# Patient Record
Sex: Female | Born: 1990 | Hispanic: No | Marital: Married | Smoking: Never smoker
Health system: Southern US, Community
[De-identification: ages and names within clinical notes are randomized; demographics above are authoritative.]

---
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
## 2016-12-20 ENCOUNTER — Encounter (HOSPITAL_COMMUNITY): Payer: Self-pay | Admitting: *Deleted

## 2016-12-20 DIAGNOSIS — Z5321 Procedure and treatment not carried out due to patient leaving prior to being seen by health care provider: Secondary | ICD-10-CM | POA: Insufficient documentation

## 2016-12-20 DIAGNOSIS — R52 Pain, unspecified: Secondary | ICD-10-CM | POA: Insufficient documentation

## 2016-12-20 NOTE — ED Triage Notes (Signed)
Generalized body aches, intermittent fevers, and nausea for several days. No meds prior to arrival for the same.

## 2016-12-21 ENCOUNTER — Emergency Department (HOSPITAL_COMMUNITY)
Admission: EM | Admit: 2016-12-21 | Discharge: 2016-12-21 | Disposition: A | Discharge status: Home / Self Care | Payer: Self-pay | Attending: Dermatology | Admitting: Dermatology

## 2018-04-26 LAB — OB RESULTS CONSOLE VARICELLA ZOSTER ANTIBODY, IGG: Varicella: IMMUNE

## 2018-05-17 ENCOUNTER — Other Ambulatory Visit: Payer: Self-pay | Admitting: Obstetrics and Gynecology

## 2018-05-17 ENCOUNTER — Other Ambulatory Visit (HOSPITAL_COMMUNITY)
Admission: RE | Admit: 2018-05-17 | Discharge: 2018-05-17 | Disposition: A | Discharge status: Home / Self Care | Payer: BLUE CROSS/BLUE SHIELD | Source: Ambulatory Visit | Attending: Obstetrics and Gynecology | Admitting: Obstetrics and Gynecology

## 2018-05-17 DIAGNOSIS — Z01411 Encounter for gynecological examination (general) (routine) with abnormal findings: Secondary | ICD-10-CM | POA: Insufficient documentation

## 2018-05-20 LAB — CYTOLOGY - PAP
CHLAMYDIA, DNA PROBE: NEGATIVE
HPV (WINDOPATH): DETECTED — AB
NEISSERIA GONORRHEA: NEGATIVE

## 2018-09-28 LAB — OB RESULTS CONSOLE RPR: RPR: NONREACTIVE

## 2018-10-31 ENCOUNTER — Ambulatory Visit: Payer: Self-pay | Admitting: Clinical

## 2018-10-31 ENCOUNTER — Ambulatory Visit (INDEPENDENT_AMBULATORY_CARE_PROVIDER_SITE_OTHER): Payer: BLUE CROSS/BLUE SHIELD | Admitting: Advanced Practice Midwife

## 2018-10-31 ENCOUNTER — Encounter: Payer: Self-pay | Admitting: Advanced Practice Midwife

## 2018-10-31 DIAGNOSIS — Z3480 Encounter for supervision of other normal pregnancy, unspecified trimester: Secondary | ICD-10-CM | POA: Insufficient documentation

## 2018-10-31 DIAGNOSIS — Z3483 Encounter for supervision of other normal pregnancy, third trimester: Secondary | ICD-10-CM

## 2018-10-31 NOTE — BH Specialist Note (Deleted)
Error

## 2018-10-31 NOTE — Patient Instructions (Signed)

## 2018-10-31 NOTE — Progress Notes (Signed)
  Subjective:   Carolyn Wallace is a 27 y.o. G1P0 at 10127w6d by LMP being seen today for her first obstetrical visit.  Her obstetrical history is significant for transfer of care late in pregnancy. Patient does intend to breast feed. Pregnancy history fully reviewed.   Patient dismissed from [facility] due to refusing Rhogam at 28 weeks. She is still feeling upset about this and does not with to discuss this again today.   Patient reports no complaints.  HISTORY: OB History  Gravida Para Term Preterm AB Living  1 0 0 0 0 0  SAB TAB Ectopic Multiple Live Births  0 0 0 0 0    # Outcome Date GA Lbr Len/2nd Weight Sex Delivery Anes PTL Lv  1 Current             Last pap smear was done 04/2018 and was abnormal - LGSIL, +HPV  History reviewed. No pertinent past medical history. Past Surgical History:  Procedure Laterality Date  . APPENDECTOMY  2007   Family History  Problem Relation Age of Onset  . Crohn's disease Paternal Uncle    Social History   Tobacco Use  . Smoking status: Never Smoker  . Smokeless tobacco: Never Used  Substance Use Topics  . Alcohol use: No  . Drug use: No   No Known Allergies Current Outpatient Medications on File Prior to Visit  Medication Sig Dispense Refill  . Prenatal MV & Min w/FA-DHA ([facility] PRENATAL GUMMY PO) Take 1 tablet by mouth daily.     No current facility-administered medications on file prior to visit.     Review of Systems Pertinent items noted in HPI and remainder of comprehensive ROS otherwise negative.  Exam   Vitals:   10/31/18 1341  BP: 106/70  Pulse: 79  Weight: 126 lb 8 oz (57.4 kg)   Fetal Heart Rate (bpm): 145  FH 33 cm  Physical Exam  Constitutional: She is oriented to person, place, and time. She appears well-developed and well-nourished. No distress.  HENT:  Head: Normocephalic.  Cardiovascular: Normal rate.  Pulmonary/Chest: Effort normal.  Abdominal: Soft. There is no tenderness.  Neurological: She is alert and  oriented to person, place, and time.  Skin: Skin is warm and dry.  Psychiatric: She has a normal mood and affect.  Nursing note and vitals reviewed.  Assessment:   Pregnancy: G1P0 Patient Active Problem List   Diagnosis Date Noted  . Supervision of other normal pregnancy, antepartum 10/31/2018     Plan:  1. Supervision of other normal pregnancy, antepartum - Routine care - Attempting to get full records, US results not scanned into epic - I did inform the patient that rhogam would again be offered prior to DC home if the baby's blood type was +. She appreciates hearing this information, and she is undecided if she will accept rhogam at time. She will consider it.   Initial labs drawn. Continue prenatal vitamins. Genetic Screening discussed, First trimester screen, Quad screen and NIPS: declined. Ultrasound discussed; fetal anatomic survey: records pending from prior office . Problem list reviewed and updated. The nature of [facility] with multiple MDs and other Advanced Practice Providers was explained to patient; also emphasized that residents, students are part of our team. Routine obstetric precautions reviewed. 50% of 45 min visit spent in counseling and coordination of care. Return in about 2 weeks (around 11/14/2018).

## 2018-11-04 LAB — CULTURE, OB URINE

## 2018-11-04 LAB — URINE CULTURE, OB REFLEX

## 2018-11-07 ENCOUNTER — Telehealth: Payer: Self-pay | Admitting: *Deleted

## 2018-11-07 NOTE — Telephone Encounter (Signed)
Called pt to inform her of her UTI and need for antibiotics.  Requested that pt provide a pharmacy for the antibiotic to be sent to.  The pt declined and stated "I don't need a prescription. I'll take care of it."  [facility].

## 2018-11-07 NOTE — Telephone Encounter (Signed)
-----   Message from Carolyn ReichertHeather D Hogan, CNM sent at 11/07/2018  8:28 AM EST ----- Patient has a UTI. Please call her and find out a pharmacy for her. There is no pharmacy on file. Need Macrobid BID x 7 days.

## 2018-11-08 NOTE — BH Specialist Note (Signed)
Patient not seen.

## 2018-11-11 ENCOUNTER — Encounter: Payer: Self-pay | Admitting: *Deleted

## 2018-11-17 ENCOUNTER — Encounter: Payer: Self-pay | Admitting: Obstetrics and Gynecology

## 2018-11-18 ENCOUNTER — Encounter (HOSPITAL_COMMUNITY): Payer: Self-pay

## 2018-11-18 ENCOUNTER — Inpatient Hospital Stay (HOSPITAL_COMMUNITY)
Admission: AD | Admit: 2018-11-18 | Discharge: 2018-11-22 | DRG: 807 | Disposition: A | Discharge status: Home / Self Care | Payer: BLUE CROSS/BLUE SHIELD | Attending: Obstetrics and Gynecology | Admitting: Obstetrics and Gynecology

## 2018-11-18 ENCOUNTER — Other Ambulatory Visit: Payer: Self-pay

## 2018-11-18 DIAGNOSIS — O4202 Full-term premature rupture of membranes, onset of labor within 24 hours of rupture: Secondary | ICD-10-CM

## 2018-11-18 DIAGNOSIS — Z3A36 36 weeks gestation of pregnancy: Secondary | ICD-10-CM

## 2018-11-18 DIAGNOSIS — Z6791 Unspecified blood type, Rh negative: Secondary | ICD-10-CM

## 2018-11-18 DIAGNOSIS — O42913 Preterm premature rupture of membranes, unspecified as to length of time between rupture and onset of labor, third trimester: Principal | ICD-10-CM | POA: Diagnosis present

## 2018-11-18 DIAGNOSIS — O26893 Other specified pregnancy related conditions, third trimester: Secondary | ICD-10-CM | POA: Diagnosis present

## 2018-11-18 DIAGNOSIS — Z3480 Encounter for supervision of other normal pregnancy, unspecified trimester: Secondary | ICD-10-CM

## 2018-11-18 DIAGNOSIS — O4290 Premature rupture of membranes, unspecified as to length of time between rupture and onset of labor, unspecified weeks of gestation: Secondary | ICD-10-CM | POA: Diagnosis present

## 2018-11-18 DIAGNOSIS — O42113 Preterm premature rupture of membranes, onset of labor more than 24 hours following rupture, third trimester: Secondary | ICD-10-CM | POA: Diagnosis not present

## 2018-11-18 DIAGNOSIS — Z6741 Type O blood, Rh negative: Secondary | ICD-10-CM | POA: Diagnosis not present

## 2018-11-18 LAB — TYPE AND SCREEN
ABO/RH(D): O NEG
ANTIBODY SCREEN: NEGATIVE

## 2018-11-18 LAB — CBC
HCT: 33.2 % — ABNORMAL LOW (ref 36.0–46.0)
Hemoglobin: 11.4 g/dL — ABNORMAL LOW (ref 12.0–15.0)
MCH: 25.6 pg — ABNORMAL LOW (ref 26.0–34.0)
MCHC: 34.3 g/dL (ref 30.0–36.0)
MCV: 74.6 fL — ABNORMAL LOW (ref 80.0–100.0)
Platelets: 126 10*3/uL — ABNORMAL LOW (ref 150–400)
RBC: 4.45 MIL/uL (ref 3.87–5.11)
RDW: 13.4 % (ref 11.5–15.5)
WBC: 8.5 10*3/uL (ref 4.0–10.5)
nRBC: 0 % (ref 0.0–0.2)

## 2018-11-18 LAB — POCT FERN TEST: POCT Fern Test: POSITIVE

## 2018-11-18 MED ORDER — SOD CITRATE-CITRIC ACID 500-334 MG/5ML PO SOLN: 30.0000 mL | ORAL | Status: DC | PRN

## 2018-11-18 MED ORDER — LACTATED RINGERS IV SOLN
500.0000 mL | INTRAVENOUS | Status: DC | PRN
  Administered 2018-11-19: 500 mL via INTRAVENOUS

## 2018-11-18 MED ORDER — OXYCODONE-ACETAMINOPHEN 5-325 MG PO TABS: 2.0000 | ORAL_TABLET | ORAL | Status: DC | PRN

## 2018-11-18 MED ORDER — LIDOCAINE HCL (PF) 1 % IJ SOLN
30.0000 mL | INTRAMUSCULAR | Status: DC | PRN
  Administered 2018-11-20: 30 mL via SUBCUTANEOUS
  Filled 2018-11-18: qty 30

## 2018-11-18 MED ORDER — PENICILLIN G 3 MILLION UNITS IVPB - SIMPLE MED
3.0000 10*6.[IU] | INTRAVENOUS | Status: DC
  Filled 2018-11-18 (×5): qty 100

## 2018-11-18 MED ORDER — OXYTOCIN 40 UNITS IN LACTATED RINGERS INFUSION - SIMPLE MED
2.5000 [IU]/h | INTRAVENOUS | Status: DC
  Administered 2018-11-20: 2.5 [IU]/h via INTRAVENOUS
  Filled 2018-11-18: qty 1000

## 2018-11-18 MED ORDER — OXYTOCIN BOLUS FROM INFUSION
500.0000 mL | Freq: Once | INTRAVENOUS | Status: AC
  Administered 2018-11-20: 500 mL via INTRAVENOUS

## 2018-11-18 MED ORDER — LACTATED RINGERS IV SOLN: INTRAVENOUS | Status: DC

## 2018-11-18 MED ORDER — SODIUM CHLORIDE 0.9 % IV SOLN
5.0000 10*6.[IU] | Freq: Once | INTRAVENOUS | Status: DC
  Filled 2018-11-18: qty 5

## 2018-11-18 MED ORDER — OXYCODONE-ACETAMINOPHEN 5-325 MG PO TABS: 1.0000 | ORAL_TABLET | ORAL | Status: DC | PRN

## 2018-11-18 MED ORDER — ONDANSETRON HCL 4 MG/2ML IJ SOLN: 4.0000 mg | Freq: Four times a day (QID) | INTRAMUSCULAR | Status: DC | PRN

## 2018-11-18 MED ORDER — ACETAMINOPHEN 325 MG PO TABS: 650.0000 mg | ORAL_TABLET | ORAL | Status: DC | PRN

## 2018-11-18 NOTE — MAU Note (Signed)
Big gush at 0600, clear fluid, still coming. No bleeding. No pain.

## 2018-11-18 NOTE — Consult Note (Signed)
Neonatology consultation:  Asked by [facility] to speak with patient about neonatal implications of withholding treatment with antenatal steroids and intrapartum antibiotic prophylaxis.  (Patient had declined IOL, BMZ, and latency PCN).  I reviewed reasons for BMZ (reduction in likelihood of respiratory distress) and IAP (reduction of risk for newborn sepsis).  Also discussed possible risk of infection to both baby and herself due to PROM and therefore the recommendation for IOL.  Patient and FOB were attentive and friendly but they had no questions and she maintained her reluctance for these interventions (told nurse she didn't like medications).  Stated she would reconsider IOL tomorrow and that she understood if she developed Sx of infection that delivery would definitely be indicated.  I volunteered to re-visit these issues if she and FOB had any second thoughts later.  Thank you for consulting Neonatology. Total time 20 minutes   Demetrius Barrell E. Barrie DunkerWimmer, Jr., MD Neonatologist

## 2018-11-18 NOTE — Progress Notes (Signed)
Patient ID: Carolyn Wallace, female   DOB: [birth year], 27 y.o.   MRN: [id number]  Comfortable; feeling mild cramping; declines induction method until the morning; "will consider" at that time; Dr Eric FormWimmer in recently to review R&Bs of PCN, BMZ  BP 109/72, P 92 FHR 140s, +accels, no decels Rare ctx Cx deferred (vtx by U/S earlier)  IUP@36 .3wks PPROM GBS unknown  Declines BMZ/PCN; agrees to GBS culture for peds purposes; discussed will not do cervical exams unless/until pt is ready to induce labor due to additional risk of infection- pt and spouse agree and are desiring to delay intervention in labor process until at least the morning  Carolyn Wallace CNM 11/18/2018 10:41 PM

## 2018-11-18 NOTE — H&P (Signed)
Carolyn Wallace is a 27 y.o. female G1P0000 at 1979w3d presenting for PROM at 1800 hours today. Dating is based on LMP. She [facility]. She denies uterine contractions, vaginal bleeding, decreased fetal movement, fever, falls, or recent illness.    Patient history includes late transfer of care to Faculty practice. She was [facility] for refusing Rhogam at 28 weeks.  OB History    Gravida  1   Para  0   Term  0   Preterm  0   AB  0   Living  0     SAB  0   TAB  0   Ectopic  0   Multiple  0   Live Births  0          Patient Active Problem List   Diagnosis Date Noted  . PROM (premature rupture of membranes) 11/18/2018  . Supervision of other normal pregnancy, antepartum 10/31/2018    History reviewed. No pertinent past medical history. Past Surgical History:  Procedure Laterality Date  . APPENDECTOMY  2007   Family History: family history includes Crohn's disease in her paternal uncle. Social History:  reports that she has never smoked. She has never used smokeless tobacco. She reports that she does not drink alcohol or use drugs.     Maternal Diabetes: No Genetic Screening: Declined Maternal Ultrasounds/Referrals: Normal Fetal Ultrasounds or other Referrals:  None Maternal Substance Abuse:  No Significant Maternal Medications:  None Significant Maternal Lab Results:  None Other Comments:  GBS unknown  Review of Systems  Respiratory: Negative for shortness of breath.   Gastrointestinal: Negative for abdominal pain.  Neurological: Negative for dizziness and headaches.  All other systems reviewed and are negative.  Maternal Medical History:  Reason for admission: Rupture of membranes.   Contractions: Onset was 1-2 hours ago.   Frequency: rare.    Fetal activity: Perceived fetal activity is normal.   Last perceived fetal movement was within the past hour.    Prenatal complications: no prenatal  complications Prenatal Complications - Diabetes: none.      Blood pressure 107/85, pulse (!) 101, temperature 97.8 F (36.6 C), temperature source Oral, resp. rate 18, weight 59.1 kg, last menstrual period 03/08/2018. Maternal Exam:  Uterine Assessment: Contraction strength is mild.  Contraction frequency is rare.   Abdomen: Patient reports no abdominal tenderness. Fetal presentation: vertex  Cervix: Cervical exam deferred  Fetal Exam Fetal Monitor Review: Baseline rate: 145.  Variability: moderate (6-25 bpm).   Pattern: accelerations present.    Fetal State Assessment: Category I - tracings are normal.     Physical Exam  Nursing note and vitals reviewed. Constitutional: She is oriented to person, place, and time. She appears well-developed and well-nourished.  Cardiovascular: Normal rate.  Respiratory: Effort normal. No respiratory distress.  GI: She exhibits no distension. There is no abdominal tenderness. There is no rebound and no guarding.  Gravid  Musculoskeletal: Normal range of motion.  Neurological: She is alert and oriented to person, place, and time.  Skin: Skin is warm and dry.  Psychiatric: She has a normal mood and affect. Her behavior is normal. Judgment and thought content normal.    Prenatal labs: ABO, Rh:   Antibody:   Rubella:   RPR: Nonreactive (10/30 0000)  HBsAg:    HIV:    GBS:     Assessment/Plan: --27 y.o. G1P0000 at 7379w3d  --PROM today at 1800 hours --Category I tracing, no UCs --Patient  declining GBS prophylaxis. Discussed recommendation for prophylaxis for preterm.  --Patient consents to IV saline lock --No birth plan but plans unmedicated labor, birth bar for second stage   Girl/breast/ does not desire contraception  Calvert CantorSamantha C Weinhold, CNM 11/18/2018, 7:54 PM

## 2018-11-18 NOTE — Progress Notes (Signed)
Pt informed that the ultrasound is considered a limited OB ultrasound and is not intended to be a complete ultrasound exam.  Patient also informed that the ultrasound is not being completed with the intent of assessing for fetal or placental anomalies or any pelvic abnormalities.  Explained that the purpose of today's ultrasound is to assess for  presentation.  Patient acknowledges the purpose of the exam and the limitations of the study.  Vertex   Carolyn Kostka, NP    

## 2018-11-19 LAB — ABO/RH: ABO/RH(D): O NEG

## 2018-11-19 LAB — RPR: RPR Ser Ql: NONREACTIVE

## 2018-11-19 MED ORDER — MISOPROSTOL 50MCG HALF TABLET
50.0000 ug | ORAL_TABLET | ORAL | Status: DC
  Administered 2018-11-19 (×2): 50 ug via ORAL
  Filled 2018-11-19 (×11): qty 1

## 2018-11-19 NOTE — Progress Notes (Signed)
LABOR PROGRESS NOTE  Carolyn Wallace is a 27 y.o. G1P0000 at 38110w4d  admitted for PPROM  Subjective: Not feeling painful contractions  Continues to leak fluid   Objective: BP 110/75   Pulse 94   Temp 98.2 F (36.8 C) (Oral)   Resp 16   Ht 5\' 4"  (1.626 m)   Wt 59.1 kg   LMP 03/08/2018 (Exact Date)   BMI 22.36 kg/m  or  Vitals:   11/19/18 0910 11/19/18 1100 11/19/18 1215 11/19/18 1400  BP: 118/77 101/65 (!) 101/59 110/75  Pulse: 91 91 100 94  Resp: 18 18 16    Temp: 98 F (36.7 C)  98 F (36.7 C) 98.2 F (36.8 C)  TempSrc: Oral  Oral Oral  Weight:      Height:        Dilation: 1.5 Effacement (%): 70, 80 Station: -3 Presentation: Vertex Exam by:: Dr. Janina MayoPhillip  FHT: baseline rate 140s, moderate varibility, +acel, no decel Toco: irritability and irregular contractions  Labs: Lab Results  Component Value Date   WBC 8.5 11/18/2018   HGB 11.4 (L) 11/18/2018   HCT 33.2 (L) 11/18/2018   MCV 74.6 (L) 11/18/2018   PLT 126 (L) 11/18/2018    Patient Active Problem List   Diagnosis Date Noted  . PROM (premature rupture of membranes) 11/18/2018  . Supervision of other normal pregnancy, antepartum 10/31/2018    Assessment / Plan: 27 y.o. G1P0000 at 42110w4d here for IOL for PPROM  Labor: patient would like to avoid all medical intervention. Encouraged pt to start nipple stimulation and continue walking to get contractions started. Patient states she will think about potentially accepting cytotec and let the team know this after around 4pm if no improvement in painful contractions at that time Fetal Wellbeing:  Cat 1 Pain Control:  Desires unmedicated birth  Anticipated MOD:  SVD  Gwenevere AbbotNimeka Gianno Volner, MD  OB Fellow  11/19/2018, 3:04 PM

## 2018-11-19 NOTE — Progress Notes (Signed)
LABOR PROGRESS NOTE  Carolyn Wallace is a 27 y.o. G1P0000 at 2869w4d admitted for IOL for PPROM  Subjective: Discussed no cervical change with patient  Patient is agreeable to 1 dose of cytotec and FB now  Objective: BP 117/82   Pulse 82   Temp (!) 97.5 F (36.4 C) (Oral)   Resp 18   Ht 5\' 4"  (1.626 m)   Wt 59.1 kg   LMP 03/08/2018 (Exact Date)   BMI 22.36 kg/m  or  Vitals:   11/19/18 1620 11/19/18 1715 11/19/18 1800 11/19/18 1905  BP: 108/75 115/74 116/77 117/82  Pulse: 83 78 77 82  Resp: 16 16 16 18   Temp: 98 F (36.7 C)  (!) 97.5 F (36.4 C)   TempSrc: Oral  Oral   Weight:      Height:        Dilation: 1.5 Effacement (%): 60, 70 Station: -2 Presentation: Vertex Exam by:: Dr. Janina MayoPhillip  FHT: baseline rate 130, moderate varibility, + acel, no decel Toco: irregular contractions  Labs: Lab Results  Component Value Date   WBC 8.5 11/18/2018   HGB 11.4 (L) 11/18/2018   HCT 33.2 (L) 11/18/2018   MCV 74.6 (L) 11/18/2018   PLT 126 (L) 11/18/2018    Patient Active Problem List   Diagnosis Date Noted  . PROM (premature rupture of membranes) 11/18/2018  . Supervision of other normal pregnancy, antepartum 10/31/2018    Assessment / Plan: 27 y.o. G1P0000 at 3269w4d here for IOL for PPROM  Labor: FB placed and PO cytotec given  Fetal Wellbeing:  Cat 1 Pain Control:  Desires unmedicated delivery  Anticipated MOD:  SVD  Carolyn AbbotNimeka Jahmil Macleod, MD  OB Fellow  11/19/2018, 7:09 PM

## 2018-11-19 NOTE — Progress Notes (Signed)
Labor Progress Note Carolyn Wallace is a 27 y.o. G1P0000 at 880w4d presented for PPROM  S:  Patient reporting worsening contractions. Foley bulb still in place  O:  BP 119/86   Pulse 90   Temp (!) 97.4 F (36.3 C) (Oral)   Resp 18   Ht 5\' 4"  (1.626 m)   Wt 59.1 kg   LMP 03/08/2018 (Exact Date)   BMI 22.36 kg/m   Fetal Tracing:  Baseline: 130 Variability: moderate Accels: 15x15 Decels: none  Toco: 1-2   CVE: Dilation: 1.5 Effacement (%): 60, 70 Station: -2 Presentation: Vertex Exam by:: Dr. Aneta MinsPhillip    A&P: 27 y.o. G1P0000 7480w4d PPROM #Labor: Patient agreeable to second dose of cytotec.  Lengthy discussion with patient about desires regarding IOL. Patient agreeable to cytotec and FB but does not want pitocin. Does not want AROM. Desires nipple stimulation and movement when foley bulb is out.   Patient signed blood product refusal form. Patient is not JW but does not want to receive blood. Denies any specific reason. Explained procedures in event of an emergency and patient adamant that she does not want blood even if death is possible.   Discussed with patient risk of infection with prolonged ROM. Patient has refused GBS prophylaxis but is agreeable to intrapartum treatment of infection if necessary.  Patient still refusing BMZ.  Patient is agreeable to c/s if fetus is in distress.   #Pain: Patient does not want medication #FWB: Cat 1 #GBS in process  Rolm Bookbinderaroline M Odean Fester, CNM 9:32 PM

## 2018-11-20 ENCOUNTER — Encounter (HOSPITAL_COMMUNITY): Payer: Self-pay

## 2018-11-20 DIAGNOSIS — Z3A36 36 weeks gestation of pregnancy: Secondary | ICD-10-CM

## 2018-11-20 DIAGNOSIS — Z6741 Type O blood, Rh negative: Secondary | ICD-10-CM

## 2018-11-20 DIAGNOSIS — O42113 Preterm premature rupture of membranes, onset of labor more than 24 hours following rupture, third trimester: Secondary | ICD-10-CM

## 2018-11-20 MED ORDER — DIBUCAINE 1 % RE OINT: 1.0000 "application " | TOPICAL_OINTMENT | RECTAL | Status: DC | PRN

## 2018-11-20 MED ORDER — IBUPROFEN 800 MG PO TABS
800.0000 mg | ORAL_TABLET | Freq: Three times a day (TID) | ORAL | Status: DC
  Administered 2018-11-20 – 2018-11-22 (×6): 800 mg via ORAL
  Filled 2018-11-20 (×8): qty 1

## 2018-11-20 MED ORDER — ONDANSETRON HCL 4 MG PO TABS: 4.0000 mg | ORAL_TABLET | ORAL | Status: DC | PRN

## 2018-11-20 MED ORDER — COCONUT OIL OIL: 1.0000 "application " | TOPICAL_OIL | Status: DC | PRN

## 2018-11-20 MED ORDER — SIMETHICONE 80 MG PO CHEW: 80.0000 mg | CHEWABLE_TABLET | ORAL | Status: DC | PRN

## 2018-11-20 MED ORDER — ONDANSETRON HCL 4 MG/2ML IJ SOLN: 4.0000 mg | INTRAMUSCULAR | Status: DC | PRN

## 2018-11-20 MED ORDER — SENNOSIDES-DOCUSATE SODIUM 8.6-50 MG PO TABS
2.0000 | ORAL_TABLET | ORAL | Status: DC
  Administered 2018-11-20 – 2018-11-21 (×2): 2 via ORAL
  Filled 2018-11-20 (×3): qty 2

## 2018-11-20 MED ORDER — OXYCODONE-ACETAMINOPHEN 5-325 MG PO TABS: 1.0000 | ORAL_TABLET | ORAL | Status: DC | PRN

## 2018-11-20 MED ORDER — ACETAMINOPHEN 325 MG PO TABS: 650.0000 mg | ORAL_TABLET | ORAL | Status: DC | PRN

## 2018-11-20 MED ORDER — BENZOCAINE-MENTHOL 20-0.5 % EX AERO
1.0000 "application " | INHALATION_SPRAY | CUTANEOUS | Status: DC | PRN
  Filled 2018-11-20: qty 56

## 2018-11-20 MED ORDER — DIPHENHYDRAMINE HCL 25 MG PO CAPS: 25.0000 mg | ORAL_CAPSULE | Freq: Four times a day (QID) | ORAL | Status: DC | PRN

## 2018-11-20 MED ORDER — TETANUS-DIPHTH-ACELL PERTUSSIS 5-2.5-18.5 LF-MCG/0.5 IM SUSP: 0.5000 mL | Freq: Once | INTRAMUSCULAR | Status: DC

## 2018-11-20 MED ORDER — LACTATED RINGERS IV SOLN: INTRAVENOUS | Status: DC

## 2018-11-20 MED ORDER — WITCH HAZEL-GLYCERIN EX PADS: 1.0000 "application " | MEDICATED_PAD | CUTANEOUS | Status: DC | PRN

## 2018-11-20 MED ORDER — PRENATAL MULTIVITAMIN CH: 1.0000 | ORAL_TABLET | Freq: Every day | ORAL | Status: DC

## 2018-11-20 MED ORDER — ZOLPIDEM TARTRATE 5 MG PO TABS: 5.0000 mg | ORAL_TABLET | Freq: Every evening | ORAL | Status: DC | PRN

## 2018-11-20 NOTE — Lactation Note (Signed)
This note was copied from a baby's chart. Lactation Consultation Note  Patient Name: Carolyn Wallace JXBJY'NToday's Date: 11/20/2018 Reason for consult: Follow-up assessment;Late-preterm 34-36.6wks;Primapara;1st time breastfeeding  P1 mother whose infant is now 7214 hours old.  This is a LPTI at 36+5 weeks weighing < 6 lbs  Mother was doing STS with baby when I arrived.  She did not have the "Caring For Your Late Preterm Baby" guidelines at bedside so I obtained these and reviewed them with the parents. Mother has been pumping with the DEBP and only obtaining a couple of drops at this time.  Reassured mother that this was typical and to continue pumping every 3 hours for 15 minutes.  Encouraged hand expression before/after feedings and pumping to help increase milk supply.  Mother will feed back any EBM she obtains to baby.  Mother will awaken baby at the three hour interval if she remains sleeping.  Parents commented that they have only had to awaken her one time so far.  She has been awakening with help and showing feeding cues.  Suggested mother ask RN/LC for latch assistance as needed and reviewed supplementation guidelines.  Reminded mother that the minimum volumes increase when baby is 1424 hours old.  Mother verbalized understanding and will follow the guidelines, allowing baby to feed more than the minimum if she desires without restriction.  Father present and supportive.   Maternal Data Formula Feeding for Exclusion: No Does the patient have breastfeeding experience prior to this delivery?: No  Feeding Feeding Type: Breast Fed Nipple Type: Slow - flow  LATCH Score                   Interventions    Lactation Tools Discussed/Used WIC Program: No Initiated by:: Already initiated; mother has no questions about pumping   Consult Status Consult Status: Follow-up Date: 11/21/18 Follow-up type: In-patient    Dora SimsBeth R Martesha Niedermeier 11/20/2018, 4:45 PM

## 2018-11-20 NOTE — Progress Notes (Signed)
Labor Progress Note Carolyn Wallace is a 27 y.o. G1P0000 at 1240w5d presented for PPROM  S:  Patient reporting worsening contractions  O:  BP 124/79   Pulse 84   Temp (!) 97.4 F (36.3 C) (Oral)   Resp 18   Ht 5\' 4"  (1.626 m)   Wt 59.1 kg   LMP 03/08/2018 (Exact Date)   BMI 22.36 kg/m    Fetal Tracing:  Baseline: 135 Variability: moderate Accels: 15x15 Decels: none  Toco:1-4 with ui   CVE: Dilation: 1.5 Effacement (%): 60, 70 Station: -2 Presentation: Vertex Exam by:: Dr. Aneta MinsPhillip    A&P: 27 y.o. G1P0000 2040w5d PPROM #Labor: Progressing well. Foley bulb fell out but patient refusing cervical exam. Reporting intermittent pressure. Will continue to observe #Pain: per patient request, nothing at this time #FWB: Cat 1 #GBS pending, refusing prophylaxis  Carolyn Wallace, CNM 1:04 AM

## 2018-11-20 NOTE — Progress Notes (Signed)
Pt and FOB does not want to be bothered. Has not had any rest for over two days. POC explained to pt. Told pt I'll stay out of her as much as possible. To call if needed.

## 2018-11-20 NOTE — Lactation Note (Signed)
This note was copied from a baby's chart. Lactation Consultation Note  Patient Name: Carolyn Wallace ZOXWR'UToday's Date: 11/20/2018 Reason for consult: Initial assessment;Late-preterm 34-36.6wks;1st time breastfeeding P1, 4 hour female infant. Mom's feeding choice at admission was breast and supplementing with formula. BF concerns: LPTI less than 5 lbs. Per day infant had one void diaper in L&D. Dad was doing STS as LC entered room. Per nurse, mom just finished breastfeeding for 10 minutes. LC did not observe latch at this time. Mom was using DEBP and mom understands to use DEBP every 3 hours for 15 minutes. Mom shown how to use DEBP & how to disassemble, clean, & reassemble parts. LC assisted dad in giving infant 5 ml of Similac Neosure 22 kcal on glove finger using curve tip syringe for supplement after breastfeeding.  Mom knows to breastfeed by hunger cues and not exceed 3 hours without breastfeeding infant. LC discussed do not feed infant longer than 30 minutes at a time. LC discussed I & O. Mom made aware of O/P services, breastfeeding support groups, community resources, and our phone # for post-discharge questions.   Mom's BF plans: 1. BF infant according hunger cues and not exceed 3 hours without breastfeeding infant. 2. Parents will supplement with EBM or formula according infant age/ hours of life after Mom's feeds at breast. 3. Parents will do as much STS as possible. 4. Mom will use DEBP every 3 hours for 15 minutes. 5. Mom will ask Nurse or LC for help if she has any question or concerns or need assistance with breastfeeding.  Maternal Data Formula Feeding for Exclusion: Yes Reason for exclusion: Mother's choice to formula and breast feed on admission Has patient been taught Hand Expression?: Yes(Mom demonstrated hand expression and colostrum present both breast.) Does the patient have breastfeeding experience prior to this delivery?: No  Feeding Feeding Type:  Formula  LATCH Score Latch: Repeated attempts needed to sustain latch, nipple held in mouth throughout feeding, stimulation needed to elicit sucking reflex.  Audible Swallowing: None  Type of Nipple: Everted at rest and after stimulation  Comfort (Breast/Nipple): Soft / non-tender  Hold (Positioning): Assistance needed to correctly position infant at breast and maintain latch.  LATCH Score: 6  Interventions Interventions: Breast feeding basics reviewed;DEBP  Lactation Tools Discussed/Used Tools: Pump Breast pump type: Double-Electric Breast Pump WIC Program: No Pump Review: Setup, frequency, and cleaning;Milk Storage Initiated by:: by Nurse Date initiated:: 11/20/18   Consult Status Consult Status: Follow-up Date: 11/20/18 Follow-up type: In-patient    Danelle EarthlyRobin Isaak Delmundo 11/20/2018, 6:17 AM

## 2018-11-20 NOTE — Progress Notes (Signed)
Delivery Note  Obstetrician: Hermina StaggersMichael L Velta Rockholt  Assistant: NA  Pre-Delivery Diagnosis: Preterm pregnancy 36 5/7 weeks, PROM  Post-Delivery Diagnosis: Viable female infant  Procedure: Spontaneous vaginal delivery  Surgeon: Hermina StaggersMichael L Patrecia Veiga   Assistants: NA  Anesthesia: Local anesthesia 1% plain lidocaine   Episiotomy or Incision: Left labial laceration  Infant Wt: As recorded  Apgars: As recorded  Placenta and Cord:         Mechanism: spontaneous       Description:  3 vessel cord and intact  Estimated Blood Loss:  155 ml               Specimens: Placenta to pathology         Complications:  prolonged ROM         Condition: stable  Infant Feeding:    breast  Pt progressed to C/C +3 and with ut ctx, viable female infant delivered without problems. Cord clamped after 1 minute delay. Cord blood collected. 3 vessel cord, intact placenta by gentle traction. Placenta to pathology. Small right labial laceration repaired with 3/0 Vicryl.  Mother and infant recovering in room together. All counts correct x 2.

## 2018-11-21 LAB — CBC
HEMATOCRIT: 29.7 % — AB (ref 36.0–46.0)
Hemoglobin: 10.2 g/dL — ABNORMAL LOW (ref 12.0–15.0)
MCH: 25.5 pg — ABNORMAL LOW (ref 26.0–34.0)
MCHC: 34.3 g/dL (ref 30.0–36.0)
MCV: 74.3 fL — ABNORMAL LOW (ref 80.0–100.0)
Platelets: 127 10*3/uL — ABNORMAL LOW (ref 150–400)
RBC: 4 MIL/uL (ref 3.87–5.11)
RDW: 13.3 % (ref 11.5–15.5)
WBC: 16.9 10*3/uL — AB (ref 4.0–10.5)
nRBC: 0 % (ref 0.0–0.2)

## 2018-11-21 LAB — CULTURE, BETA STREP (GROUP B ONLY)

## 2018-11-21 NOTE — Lactation Note (Signed)
This note was copied from a baby's chart. Lactation Consultation Note  Patient Name: Girl Cam Haialia Nobbe ZOXWR'UToday's Date: 11/21/2018 Reason for consult: Follow-up assessment;Infant < 6lbs;Late-preterm 34-36.6wks Baby is 6635 hours old.  Mom is breastfeeding with cues and supplementing with formula after.  She is also pumping every 3 hours to stimulate a good milk supply.  Parents will begin supplementing with slow flow nipples instead of finger feeding.  Mom denies questions or concerns.  Encouraged to continue plan following late preterm guidelines and call for assist prn.  Maternal Data    Feeding    LATCH Score                   Interventions    Lactation Tools Discussed/Used     Consult Status Consult Status: Follow-up Date: 11/22/18 Follow-up type: In-patient    Huston FoleyMOULDEN, Athanasius Kesling S 11/21/2018, 1:02 PM

## 2018-11-21 NOTE — Progress Notes (Signed)
Post Partum Day 1  Subjective: Doing well, no complaints this morning. Was able to get some sleep last night. Thinks will be staying until tomorrow because of baby being a little early.  Objective: Blood pressure 107/66, pulse 60, temperature 98.3 F (36.8 C), resp. rate 16, height 5\' 4"  (1.626 m), weight 59.1 kg, last menstrual period 03/08/2018, SpO2 100 %, unknown if currently breastfeeding.  Physical Exam:  General: well-appearing, NAD Lochia: appropriate Uterine Fundus: firm Incision: n/a DVT Evaluation: No significant calf/ankle edema.  Recent Labs    11/18/18 2000 11/21/18 0519  HGB 11.4* 10.2*  HCT 33.2* 29.7*    Assessment/Plan: Plan for discharge tomorrow  Lactation support as needed Stable H/H  Did not receive RhoGAM in pregnancy, Rh(-), infant also Rh(-) so no Rhogam indicated at this time   LOS: 3 days   Laurel S Wallace 11/21/2018, 3:52 PM

## 2018-11-22 MED ORDER — IBUPROFEN 800 MG PO TABS: 800.0000 mg | ORAL_TABLET | Freq: Three times a day (TID) | ORAL | 1 refills | Status: DC

## 2018-11-22 MED ORDER — SENNOSIDES-DOCUSATE SODIUM 8.6-50 MG PO TABS: 2.0000 | ORAL_TABLET | ORAL | 0 refills | Status: DC

## 2018-11-22 NOTE — Lactation Note (Signed)
This note was copied from a baby's chart. Lactation Consultation Note  Patient Name: Carolyn Wallace PRAFO'A Date: 11/22/2018  Mom is experiencing lactogenesis II. Her breasts are filling nicely. Infant just fed for 20 min. Parents report that they hear 'gulping" when infant is at breast.  Mom reports that she pumps about 4 times/day. The last time she pumped she was able to get 72m from each breast.   Infant recently fed for 20 minutes, so Mom did not offer formula after the feeding. Mom says she offers formula if infant does not feed long. I asked Mom to listen for frequency of swallows to help her determine if infant should receive a bottle afterwards. Swallowing frequency was discussed & Mom thinks that infant's suck:swallow ratio is typically 1:1. Mom understands that infant's feeding capabilities will become more consistent as she nears her due date. However, parents are doing a great job, as ILicensed conveyancerhas gained 21g.  Mom has a Spectra pump at home. Parents are aware that they also have a hand pump that was provided in the breast pump kit.   RMatthias HughsHMarion General Hospital12/24/2019, 10:44 AM

## 2018-11-22 NOTE — Discharge Instructions (Signed)

## 2018-11-22 NOTE — Discharge Summary (Signed)
OB Discharge Summary     Patient Name: Carolyn Wallace DOB: Jun 10, 1991 MRN: [id number]  Date of admission: 12/20/Cam Hai2019 Delivering MD: Hermina StaggersERVIN, MICHAEL L   Date of discharge: 11/22/2018  Admitting diagnosis: 36 wks, leaking fluids Intrauterine pregnancy: 1033w5d     Secondary diagnosis:  Active Problems:   PROM (premature rupture of membranes)   Postpartum care following vaginal delivery      Discharge diagnosis: Term Pregnancy Delivered                                                                                                Post partum procedures: Patient declined Rhogam, Baby was found to be Rh-   Augmentation: Cytotec  Complications: None  Hospital course:  Onset of Labor With Vaginal Delivery     27 y.o. yo G1P0101 at 6033w5d was admitted in Latent Labor on 11/18/2018. Patient had an uncomplicated labor course as follows:  Membrane Rupture Time/Date: 6:00 PM ,11/18/2018   Intrapartum Procedures: Episiotomy: None [1]                                         Lacerations:  Labial [10]  Patient had a delivery of a Viable infant. 11/20/2018  Information for the patient's newborn:  Priscille KluverCarethers, Girl Jovita Gammaalia [[id number]][[id number]]  Delivery Method: Vag-Spont    Pateint had an uncomplicated postpartum course.  She is ambulating, tolerating a regular diet, passing flatus, and urinating well. Patient is discharged home in stable condition on 11/22/18.   Physical exam  Vitals:   11/21/18 0523 11/21/18 1421 11/21/18 2324 11/22/18 0657  BP: 99/63 107/66 109/77 102/71  Pulse: 62 60 (!) 58 60  Resp: 18 16 14 17   Temp: 98.1 F (36.7 C) 98.3 F (36.8 C)  98.1 F (36.7 C)  TempSrc: Oral   Oral  SpO2:   100% 99%  Weight:      Height:       General: alert and cooperative Lochia: appropriate Uterine Fundus: firm Incision: N/A DVT Evaluation: No evidence of DVT seen on physical exam. Labs: Lab Results  Component Value Date   WBC 16.9 (H) 11/21/2018   HGB 10.2 (L) 11/21/2018   HCT 29.7  (L) 11/21/2018   MCV 74.3 (L) 11/21/2018   PLT 127 (L) 11/21/2018   No flowsheet data found.  Discharge instruction: per After Visit Summary and "Baby and Me Booklet".  After visit meds:  Allergies as of 11/22/2018   No Known Allergies     Medication List    TAKE these medications   [employer] PRENATAL GUMMY PO Take 2 tablets by mouth daily.   ibuprofen 800 MG tablet Commonly known as:  ADVIL,MOTRIN Take 1 tablet (800 mg total) by mouth every 8 (eight) hours.   senna-docusate 8.6-50 MG tablet Commonly known as:  Senokot-S Take 2 tablets by mouth daily.       Diet: routine diet  Activity: Advance as tolerated. Pelvic rest for 6 weeks.   Outpatient follow up:4 weeks Follow up Appt:No future appointments.  Follow up Visit:No follow-ups on file.  Postpartum contraception: None  Newborn Data: Live born female  Birth Weight: 4 lb 15.6 oz (2257 g) APGAR: 8, 9  Newborn Delivery   Birth date/time:  11/20/2018 01:52:00 Delivery type:  Vaginal, Spontaneous     Baby Feeding: Breast Disposition:home with mother   11/22/2018 De Hollingsheadatherine L Andera Cranmer, DO

## 2018-11-24 ENCOUNTER — Encounter: Payer: Self-pay | Admitting: Obstetrics & Gynecology

## 2018-12-07 ENCOUNTER — Telehealth: Payer: Self-pay

## 2018-12-07 NOTE — Telephone Encounter (Signed)
Pt called and stated that she needs a work letter from delivery of her daughter.  Attempted to contact pt was unable to LM due to VM box full. MyChart message sent.

## 2019-12-01 NOTE — L&D Delivery Note (Signed)
Delivery Note Presented from [facility]U completely dilated and feeling the urge to push.  Fetal heart rate was 120s but was decelerating to 50s with pushes.  Dr Despina Hidden called and he arrived. Preparations were made for vacuum assisted vaginal delivery by me.    Neonatology team was present due to premature infant.   At 7:25 AM a viable and healthy female was delivered via Vaginal, Vacuum (Extractor) (Presentation: Left Occiput Posterior).  APGAR: 8, 9; weight  .   Placenta status: Spontaneous, Intact.  Cord: 3 vessels with the following complications: None.  Cord pH: 7.24  Anesthesia:  none Episiotomy:  none Lacerations:  none Suture Repair: n/a Est. Blood Loss (mL):  300  Mom to postpartum.  Baby to Couplet care / Skin to Skin.  Wynelle Bourgeois 05/11/2020, 8:03 AM  Please schedule this patient for Postpartum visit in: 4 weeks with the following provider: Any provider In-Person For C/S patients schedule nurse incision check in weeks 2 weeks: no Low risk pregnancy complicated by: preterm labor Delivery mode:  Vacuum Anticipated Birth Control:  other/unsure PP Procedures needed: none  Schedule Integrated BH visit: no

## 2019-12-11 ENCOUNTER — Encounter: Payer: Self-pay | Admitting: General Practice

## 2019-12-11 ENCOUNTER — Other Ambulatory Visit: Payer: Self-pay

## 2019-12-11 ENCOUNTER — Ambulatory Visit (INDEPENDENT_AMBULATORY_CARE_PROVIDER_SITE_OTHER): Payer: BC Managed Care – PPO | Admitting: *Deleted

## 2019-12-11 VITALS — BP 109/71 | HR 77 | Temp 97.8°F | Ht 64.0 in | Wt 115.2 lb

## 2019-12-11 DIAGNOSIS — Z3480 Encounter for supervision of other normal pregnancy, unspecified trimester: Secondary | ICD-10-CM | POA: Insufficient documentation

## 2019-12-11 DIAGNOSIS — Z3201 Encounter for pregnancy test, result positive: Secondary | ICD-10-CM

## 2019-12-11 DIAGNOSIS — Z3200 Encounter for pregnancy test, result unknown: Secondary | ICD-10-CM

## 2019-12-11 HISTORY — DX: Encounter for supervision of other normal pregnancy, unspecified trimester: Z34.80

## 2019-12-11 LAB — POCT URINE PREGNANCY: Preg Test, Ur: POSITIVE — AB

## 2019-12-11 MED ORDER — BLOOD PRESSURE MONITOR AUTOMAT DEVI: 1.0000 | Freq: Every day | 0 refills | Status: DC

## 2019-12-11 NOTE — Progress Notes (Signed)
   PRENATAL INTAKE SUMMARY  Carolyn Wallace presents today for UPT.  Carolyn Wallace presents today New OB Nurse Interview.  OB History    Gravida  2   Para  1   Term  0   Preterm  1   AB  0   Living  1     SAB  0   TAB  0   Ectopic  0   Multiple  0   Live Births  1          I have reviewed the patient's medical, obstetrical, social, and family histories, medications, and available lab results.  SUBJECTIVE She has no unusual complaints  OBJECTIVE Initial nurse interview for history and labs (New OB)  Home UPT Result:Positive In-Office UPT result: Positive  EDD: 06/10/2020 by LMP GA:[redacted]w[redacted]d [id number] FHT: 156  GENERAL APPEARANCE: alert, well appearing, in no apparent distress, oriented to person, place and time   ASSESSMENT Normal pregnancy  PLAN Prenatal care-[facility] Pnl/HIV  OB Urine Culture GC/CT/PAP at next visit with midwife HgbEval SMA CF Declined Genetic screening Declined Flu  Continue PNV Sign up for Babyscripts Rx for BP cuff sent to pharmacy  Clovis Pu, RN

## 2019-12-13 LAB — CULTURE, OB URINE

## 2019-12-13 LAB — URINE CULTURE, OB REFLEX

## 2019-12-15 LAB — OBSTETRIC PANEL, INCLUDING HIV
Antibody Screen: NEGATIVE
Basophils Absolute: 0 10*3/uL (ref 0.0–0.2)
Basos: 0 %
EOS (ABSOLUTE): 0.1 10*3/uL (ref 0.0–0.4)
Eos: 1 %
HIV Screen 4th Generation wRfx: NONREACTIVE
Hematocrit: 39.8 % (ref 34.0–46.6)
Hemoglobin: 12.9 g/dL (ref 11.1–15.9)
Hepatitis B Surface Ag: NEGATIVE
Immature Grans (Abs): 0 10*3/uL (ref 0.0–0.1)
Immature Granulocytes: 0 %
Lymphocytes Absolute: 3.2 10*3/uL — ABNORMAL HIGH (ref 0.7–3.1)
Lymphs: 38 %
MCH: 25.9 pg — ABNORMAL LOW (ref 26.6–33.0)
MCHC: 32.4 g/dL (ref 31.5–35.7)
MCV: 80 fL (ref 79–97)
Monocytes Absolute: 0.5 10*3/uL (ref 0.1–0.9)
Monocytes: 5 %
Neutrophils Absolute: 4.7 10*3/uL (ref 1.4–7.0)
Neutrophils: 56 %
Platelets: 205 10*3/uL (ref 150–450)
RBC: 4.98 x10E6/uL (ref 3.77–5.28)
RDW: 13.4 % (ref 11.7–15.4)
RPR Ser Ql: NONREACTIVE
Rh Factor: NEGATIVE
Rubella Antibodies, IGG: 3.93 index (ref >0.99)
WBC: 8.5 10*3/uL (ref 3.4–10.8)

## 2019-12-15 LAB — CYSTIC FIBROSIS MUTATION 97: Interpretation: NOT DETECTED

## 2019-12-21 ENCOUNTER — Other Ambulatory Visit: Payer: Self-pay

## 2019-12-21 DIAGNOSIS — D582 Other hemoglobinopathies: Secondary | ICD-10-CM | POA: Insufficient documentation

## 2019-12-22 LAB — SMN1 COPY NUMBER ANALYSIS (SMA CARRIER SCREENING)

## 2019-12-22 LAB — HEMOGLOBINOPATHY EVALUATION
HGB C: 34.5 % — ABNORMAL HIGH
HGB S: 0 %
HGB VARIANT: 0 %
Hemoglobin A2 Quantitation: 3.1 % (ref 1.8–3.2)
Hemoglobin F Quantitation: 1.4 % (ref 0.0–2.0)
Hgb A: 61 % — ABNORMAL LOW (ref 96.4–98.8)

## 2020-01-03 ENCOUNTER — Ambulatory Visit (INDEPENDENT_AMBULATORY_CARE_PROVIDER_SITE_OTHER): Payer: BC Managed Care – PPO

## 2020-01-03 ENCOUNTER — Telehealth: Payer: Self-pay | Admitting: General Practice

## 2020-01-03 ENCOUNTER — Other Ambulatory Visit: Payer: Self-pay

## 2020-01-03 VITALS — BP 103/67 | HR 92 | Temp 98.1°F | Wt 116.8 lb

## 2020-01-03 DIAGNOSIS — O09212 Supervision of pregnancy with history of pre-term labor, second trimester: Secondary | ICD-10-CM | POA: Diagnosis not present

## 2020-01-03 DIAGNOSIS — O360920 Maternal care for other rhesus isoimmunization, second trimester, not applicable or unspecified: Secondary | ICD-10-CM

## 2020-01-03 DIAGNOSIS — B373 Candidiasis of vulva and vagina: Secondary | ICD-10-CM | POA: Diagnosis not present

## 2020-01-03 DIAGNOSIS — O26892 Other specified pregnancy related conditions, second trimester: Secondary | ICD-10-CM

## 2020-01-03 DIAGNOSIS — N898 Other specified noninflammatory disorders of vagina: Secondary | ICD-10-CM

## 2020-01-03 DIAGNOSIS — O26899 Other specified pregnancy related conditions, unspecified trimester: Secondary | ICD-10-CM | POA: Insufficient documentation

## 2020-01-03 DIAGNOSIS — Z124 Encounter for screening for malignant neoplasm of cervix: Secondary | ICD-10-CM

## 2020-01-03 DIAGNOSIS — Z3480 Encounter for supervision of other normal pregnancy, unspecified trimester: Secondary | ICD-10-CM

## 2020-01-03 DIAGNOSIS — Z113 Encounter for screening for infections with a predominantly sexual mode of transmission: Secondary | ICD-10-CM

## 2020-01-03 DIAGNOSIS — Z6791 Unspecified blood type, Rh negative: Secondary | ICD-10-CM

## 2020-01-03 DIAGNOSIS — Z3A17 17 weeks gestation of pregnancy: Secondary | ICD-10-CM | POA: Diagnosis not present

## 2020-01-03 DIAGNOSIS — O3442 Maternal care for other abnormalities of cervix, second trimester: Secondary | ICD-10-CM | POA: Insufficient documentation

## 2020-01-03 DIAGNOSIS — Z8751 Personal history of pre-term labor: Secondary | ICD-10-CM | POA: Insufficient documentation

## 2020-01-03 NOTE — Progress Notes (Signed)
Subjective:   Carolyn Wallace is a 29 y.o. G2P0101 at [redacted]w[redacted]d by definite LMP of Sep 04, 2019 being seen today for her first obstetrical visit.  Her obstetrical history is significant for late preterm delivery and she denies having any medical history.  She does report having an appendectomy in 7th grade.  Patient denies GI concerns including n/v, constipation, diarrhea.  She also denies usage of ETOH or drugs during pregnancy.  She has no pain or discomfort during sex and initially denies vaginal discharge.  She then reports that she has "normal vaginal discharge that is thicker than usual and is creamy white."  However, patient denies itching, irritation, or odor.  She is married to [place], who is supportive, and does not work outside the home.   Patient denies flu vaccine and initiation of Makena injections for reduced risk of PTL/D.  HISTORY: OB History  Gravida Para Term Preterm AB Living  2 1 0 1 0 1  SAB TAB Ectopic Multiple Live Births  0 0 0 0 1    # Outcome Date GA Lbr Len/2nd Weight Sex Delivery Anes PTL Lv  2 Current           1 Preterm 11/20/18 [redacted]w[redacted]d 02:11 / 00:11 4 lb 15.6 oz (2.257 kg) F Vag-Spont None  LIV     Birth Comments: WNL     Name: Carolyn Wallace     Apgar1: 8  Apgar5: 9    Last pap smear was done 04/2018 and was abnormal - LGSIl with HPV  No past medical history on file. Past Surgical History:  Procedure Laterality Date  . APPENDECTOMY  2007   Family History  Problem Relation Age of Onset  . Crohn's disease Paternal Uncle    Social History   Tobacco Use  . Smoking status: Never Smoker  . Smokeless tobacco: Never Used  Substance Use Topics  . Alcohol use: No  . Drug use: No   No Known Allergies Current Outpatient Medications on File Prior to Visit  Medication Sig Dispense Refill  . Blood Pressure Monitoring (BLOOD PRESSURE MONITOR AUTOMAT) DEVI 1 Device by Does not apply route daily. Automatic blood pressure cuff regular size. To monitor blood  pressure regularly at home. ICD-10 code: O09.90. 1 each 0  . Prenatal MV & Min w/FA-DHA (ONE A DAY PRENATAL) 0.4-25 MG CHEW Chew 2 each by mouth daily.     No current facility-administered medications on file prior to visit.    Review of Systems Pertinent items noted in HPI and remainder of comprehensive ROS otherwise negative.  Exam   Vitals:   01/03/20 0941  BP: 103/67  Pulse: 92  Temp: 98.1 F (36.7 C)  Weight: 116 lb 12.8 oz (53 kg)   Fetal Heart Rate (bpm): 156  Physical Exam Constitutional:      General: She is not in acute distress.    Appearance: Normal appearance. She is normal weight.  Genitourinary:     No vulval lesion or tenderness noted.     Vaginal discharge (Thick white-scant amt. Nonodorous.) present.     No vaginal bleeding.     Cervix is parous.     No cervical motion tenderness, friability, erythema, bleeding, polyp or nabothian cyst.     Uterus is enlarged.     Genitourinary Comments: CV collected Pap collected with brush and spatula. BME reveals cervix with funneling through external os, but closed internal os.  Soft. ~50 effacement. Uterus c/w dates and palpated ~2FB  below umbilicus.  HENT:     Head: Normocephalic and atraumatic.  Eyes:     Conjunctiva/sclera: Conjunctivae normal.  Cardiovascular:     Rate and Rhythm: Normal rate and regular rhythm.     Heart sounds: Normal heart sounds.  Pulmonary:     Breath sounds: Normal breath sounds.  Abdominal:     General: Bowel sounds are normal.  Musculoskeletal:        General: Normal range of motion.     Cervical back: Normal range of motion.  Neurological:     Mental Status: She is alert and oriented to person, place, and time.  Skin:    General: Skin is warm and dry.  Psychiatric:        Mood and Affect: Mood normal.        Thought Content: Thought content normal.        Judgment: Judgment normal.  Vitals reviewed.     Assessment:   29 y.o. year old G2P0101 Patient Active Problem  List   Diagnosis Date Noted  . 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 Supervision of other normal pregnancy, antepartum 12/11/2019     Plan:  1. Supervision of other normal pregnancy, antepartum -Congratulations given and patient welcomed to practice. -Discussed usage of Babyscripts and virtual visits as additional source of managing and completing PN visits in midst of coronavirus.   -Anticipatory guidance for prenatal visits including labs, ultrasounds, and testing; Initial labs drawn. -Genetic Screening discussed, NIPS: declined. -Encouraged to complete MyChart Registration for her ability to review results, send requests, and have questions addressed.  -Discussed estimated due date of June 10, 2020. -Ultrasound discussed; fetal anatomic survey: ordered. -Initiate prenatal vitamins; Rx sent to pharmacy on file.  -Influenza offered and declined. -Encouraged to seek out care at office or emergency room for urgent and/or emergent concerns. -Educated on the nature of [facility] with multiple MDs and other Advanced Practice Providers was explained to patient; also emphasized that residents, students are part of our team. Informed of her right to refuse care as she deems appropriate.  -No questions or concerns.   2. Vaginal discharge during pregnancy, antepartum -CV collected and sent. -Will treat appropriately  3. Pap smear for cervical cancer screening -Reviewed history of abnormal pap smear and positive HPV. -Will repeat pap today and follow up as appropriate.  4. History of preterm delivery -Discussed increased risk of preterm labor and delivery d/t history. -Reassured that first delivery was late preterm, but still preterm. -Reviewed and highly recommended Makena injections esp after exam findings. -Patient declines at current and information sheets given regarding 17p and ways to avoid PTL.  5. Abnormality of cervix during pregnancy in second  trimester -Cervix feels short on BME. -Findings discussed with patient.  Questions and concerns addressed. -Instructed to initiate pelvic rest until ultrasound confirms cervical length to contrary of exam findings. -MFM contacted and patient scheduled for [place] for earliest available Thursday-Jan 11th. -Patient to have genetic counseling at that time for HgB C trait as well.    Problem list reviewed and updated. Routine obstetric precautions reviewed.  Orders Placed This Encounter  Procedures  . [place] MFM OB DETAIL +14 WK    Standing Status:   Future    Standing Expiration Date:   03/02/2021    Order Specific Question:   Reason for Exam (SYMPTOM  OR DIAGNOSIS REQUIRED)    Answer:   Anatomy Scan    Order Specific Question:  Preferred Location    Answer:   [facility]    Return in about 3 weeks (around 01/24/2020) for LR-ROB via Virtual Visit.     Cherre Robins, CNM 01/03/2020 10:00 AM

## 2020-01-03 NOTE — Telephone Encounter (Signed)
Patient called and stated that she wanted a second opinion in regards to having a short cervix.  Patient will be transferring care to [facility] and wanted to cancel follow up appt on 01/24/2020.

## 2020-01-03 NOTE — Progress Notes (Signed)
rou

## 2020-01-03 NOTE — Patient Instructions (Addendum)
Hydroxyprogesterone caproate injection for pregnancy What is this medicine? HYDROXYPROGESTERONE (hye drox ee proe JES ter one) is a female hormone. This medicine is used in women who are pregnant and who have delivered a baby too early (preterm) in the past. It helps lower the risk of having a preterm baby again. This medicine may be used for other purposes; ask your health care provider or pharmacist if you have questions. COMMON BRAND NAME(S): Makena What should I tell my health care provider before I take this medicine? They need to know if you have any of these conditions:  breast, cervical, uterine, or vaginal cancer  depression  diabetes or prediabetes  heart disease  high blood pressure  history of blood clots  kidney disease  liver disease  lung or breathing disease, like asthma  migraine headaches  seizures  vaginal bleeding  an unusual or allergic reaction to hydroxyprogesterone, other hormones, castor oil, benzyl alcohol, other medicines, foods, dyes, or preservatives  breast-feeding How should I use this medicine? This medicine is for injection into a muscle or under the skin. You will receive an injection once every week (every 7 days) as directed during your pregnancy. It is given by a health care professional in a hospital or clinic setting. Talk to your pediatrician regarding the use of this medicine in children. While this drug may be prescribed for pregnant women as young as 16 years, precautions do apply. Overdosage: If you think you have taken too much of this medicine contact a poison control center or emergency room at once. NOTE: This medicine is only for you. Do not share this medicine with others. What if I miss a dose? It is important not to miss your dose. Call your doctor or health care professional if you are unable to keep an appointment. What may interact with this medicine? Significant interactions are not expected. This list may not  describe all possible interactions. Give your health care provider a list of all the medicines, herbs, non-prescription drugs, or dietary supplements you use. Also tell them if you smoke, drink alcohol, or use illegal drugs. Some items may interact with your medicine. What should I watch for while using this medicine? Your pregnancy will be monitored carefully while you are receiving this medicine. What side effects may I notice from receiving this medicine? Side effects that you should report to your doctor or health care professional as soon as possible:  allergic reactions like skin rash, itching or hives, swelling of the face, lips, or tongue  breathing problems  depressed mood  increase in blood pressure  increased hunger or thirst  increased urination  signs and symptoms of a blood clot such as breathing problems; changes in vision; chest pain; severe, sudden headache; pain, swelling, warmth in the leg; trouble speaking; sudden numbness or weakness of the face, arm or leg  unusually weak or tired  unusual vaginal bleeding  yellowing of the eyes or skin Side effects that usually do not require medical attention (report to your doctor or health care professional if they continue or are bothersome):  diarrhea  fluid retention and swelling  nausea  pain, redness, or irritation at site where injected This list may not describe all possible side effects. Call your doctor for medical advice about side effects. You may report side effects to FDA at 1-800-FDA-1088. Where should I keep my medicine? This drug is given in a hospital or clinic and will not be stored at home. NOTE: This sheet is a   summary. It may not cover all possible information. If you have questions about this medicine, talk to your doctor, pharmacist, or health care provider.  2020 Elsevier/Gold Standard (2017-08-01 11:14:47)  Preventing Preterm Birth Preterm birth is when your baby is delivered between 20  weeks and 37 weeks of pregnancy. A full-term pregnancy lasts for at least 37 weeks. Preterm birth can be dangerous for your baby because the last few weeks of pregnancy are an important time for your baby's brain and lungs to grow. Many things can cause a baby to be born early. Sometimes the cause is not known. There are certain factors that make you more likely to experience preterm birth, such as:  Having a previous baby born preterm.  Being pregnant with twins or other multiples.  Having had fertility treatment.  Being overweight or underweight at the start of your pregnancy.  Having any of the following during pregnancy: ? An infection, including a urinary tract infection (UTI) or an STI (sexually transmitted infection). ? High blood pressure. ? Diabetes. ? Vaginal bleeding.  Being age 38 or older.  Being age 87 or younger.  Getting pregnant within 6 months of a previous pregnancy.  Suffering extreme stress or physical or emotional abuse during pregnancy.  Standing for long periods of time during pregnancy, such as working at a job that requires standing. What are the risks? The most serious risk of preterm birth is that the baby may not survive. This is more likely to happen if a baby is born before 34 weeks. Other risks and complications of preterm birth may include your baby having:  Breathing problems.  Brain damage that affects movement and coordination (cerebral palsy).  Feeding difficulties.  Vision or hearing problems.  Infections or inflammation of the digestive tract (colitis).  Developmental delays.  Learning disabilities.  Higher risk for diabetes, heart disease, and high blood pressure later in life. What can I do to lower my risk?  Medical care The most important thing you can do to lower your risk for preterm birth is to get routine medical care during pregnancy (prenatal care). If you have a high risk of preterm birth, you may be referred to a health  care provider who specializes in managing high-risk pregnancies (perinatologist). You may be given medicine to help prevent preterm birth. Lifestyle changes Certain lifestyle changes can also lower your risk of preterm birth:  Wait at least 6 months after a pregnancy to become pregnant again.  Try to plan pregnancy for when you are between 71 and 44 years old.  Get to a healthy weight before getting pregnant. If you are overweight, work with your health care provider to safely lose weight.  Do not use any products that contain nicotine or tobacco, such as cigarettes and e-cigarettes. If you need help quitting, ask your health care provider.  Do not drink alcohol.  Do not use drugs. Where to find support For more support, consider:  Talking with your health care provider.  Talking with a therapist or substance abuse counselor, if you need help quitting.  Working with a diet and nutrition specialist (dietitian) or a Systems analyst to maintain a healthy weight.  Joining a support group. Where to find more information Learn more about preventing preterm birth from:  Centers for Disease Control and Prevention: http://curry.org/  March of Dimes: marchofdimes.org/complications/premature-babies.aspx  American Pregnancy Association: americanpregnancy.org/labor-and-birth/premature-labor Contact a health care provider if:  You have any of the following signs of preterm labor before 37 weeks: ?  A change or increase in vaginal discharge. ? Fluid leaking from your vagina. ? Pressure or cramps in your lower abdomen. ? A backache that does not go away or gets worse. ? Regular tightening (contractions) in your lower abdomen. Summary  Preterm birth means having your baby during weeks 62-37 of pregnancy.  Preterm birth may put your baby at risk for physical and mental problems.  Getting good prenatal care can help prevent preterm  birth.  You can lower your risk of preterm birth by making certain lifestyle changes, such as not smoking and not using alcohol. This information is not intended to replace advice given to you by your health care provider. Make sure you discuss any questions you have with your health care provider. Document Revised: 10/29/2017 Document Reviewed: 07/25/2016 Elsevier Patient Education  2020 [employer].

## 2020-01-04 LAB — CERVICOVAGINAL ANCILLARY ONLY
Bacterial Vaginitis (gardnerella): NEGATIVE
Candida Glabrata: NEGATIVE
Candida Vaginitis: POSITIVE — AB
Chlamydia: NEGATIVE
Comment: NEGATIVE
Comment: NEGATIVE
Comment: NEGATIVE
Comment: NEGATIVE
Comment: NEGATIVE
Comment: NORMAL
Neisseria Gonorrhea: NEGATIVE
Trichomonas: NEGATIVE

## 2020-01-04 LAB — CYTOLOGY - PAP: Diagnosis: NEGATIVE

## 2020-01-05 ENCOUNTER — Other Ambulatory Visit: Payer: Self-pay

## 2020-01-05 DIAGNOSIS — B3731 Acute candidiasis of vulva and vagina: Secondary | ICD-10-CM

## 2020-01-05 DIAGNOSIS — B373 Candidiasis of vulva and vagina: Secondary | ICD-10-CM

## 2020-01-05 MED ORDER — TERCONAZOLE 0.8 % VA CREA: 1.0000 | TOPICAL_CREAM | Freq: Every day | VAGINAL | 0 refills | Status: DC

## 2020-01-11 ENCOUNTER — Ambulatory Visit (HOSPITAL_COMMUNITY): Payer: BC Managed Care – PPO | Admitting: *Deleted

## 2020-01-11 ENCOUNTER — Other Ambulatory Visit (HOSPITAL_COMMUNITY): Payer: Self-pay | Admitting: *Deleted

## 2020-01-11 ENCOUNTER — Ambulatory Visit (HOSPITAL_COMMUNITY)
Admission: RE | Admit: 2020-01-11 | Discharge: 2020-01-11 | Disposition: A | Discharge status: Home / Self Care | Payer: BC Managed Care – PPO | Source: Ambulatory Visit | Attending: Obstetrics and Gynecology | Admitting: Obstetrics and Gynecology

## 2020-01-11 ENCOUNTER — Ambulatory Visit (HOSPITAL_COMMUNITY): Payer: Self-pay | Admitting: Genetic Counselor

## 2020-01-11 ENCOUNTER — Encounter (HOSPITAL_COMMUNITY): Payer: Self-pay

## 2020-01-11 ENCOUNTER — Other Ambulatory Visit: Payer: Self-pay

## 2020-01-11 ENCOUNTER — Ambulatory Visit (HOSPITAL_BASED_OUTPATIENT_CLINIC_OR_DEPARTMENT_OTHER): Payer: BC Managed Care – PPO | Admitting: Genetic Counselor

## 2020-01-11 VITALS — BP 100/63 | HR 80 | Temp 97.8°F

## 2020-01-11 DIAGNOSIS — O09212 Supervision of pregnancy with history of pre-term labor, second trimester: Secondary | ICD-10-CM

## 2020-01-11 DIAGNOSIS — O0990 Supervision of high risk pregnancy, unspecified, unspecified trimester: Secondary | ICD-10-CM

## 2020-01-11 DIAGNOSIS — O09292 Supervision of pregnancy with other poor reproductive or obstetric history, second trimester: Secondary | ICD-10-CM

## 2020-01-11 DIAGNOSIS — Z3480 Encounter for supervision of other normal pregnancy, unspecified trimester: Secondary | ICD-10-CM | POA: Diagnosis present

## 2020-01-11 DIAGNOSIS — Z3A18 18 weeks gestation of pregnancy: Secondary | ICD-10-CM | POA: Diagnosis not present

## 2020-01-11 DIAGNOSIS — Z315 Encounter for genetic counseling: Secondary | ICD-10-CM | POA: Diagnosis not present

## 2020-01-11 DIAGNOSIS — D582 Other hemoglobinopathies: Secondary | ICD-10-CM

## 2020-01-11 DIAGNOSIS — Z362 Encounter for other antenatal screening follow-up: Secondary | ICD-10-CM

## 2020-01-23 NOTE — Progress Notes (Signed)
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 Carolyn Wallace came to her appointment alone due to COVID-19 visitor restrictions.   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 female. Her current pregnancy has completed [redacted]w[redacted]d (Estimated Date of Delivery: 06/10/20).  Carolyn Wallace denied exposure to environmental toxins or chemical agents. She denied the use of alcohol, tobacco or street drugs. She reported taking prenatal vitamins. She denied significant viral illnesses, fevers, and bleeding during the course of her pregnancy. Her medical and surgical histories were noncontributory.  Family History  A three generation pedigree was drafted and reviewed. The family history is remarkable for the following:  - Carolyn Wallace's mother had three miscarriages. She also had three healthy children. We reviewed that there are many potential causes of recurrent miscarriages, including anatomic, immunologic, endocrine, and genetic factors. However, a cause is only identified in 50% of individuals who experience recurrent miscarriages. Abnormalities of chromosome number or structure are the most common cause of sporadic early pregnancy loss. A significant proportion of recurrent miscarriages may also be associated with structural or numerical chromosome abnormalities. We discussed that 2-5% of couples who experience multiple miscarriages carry a balanced translocation that can become unbalanced and potentially lethal in their offspring. In addition to chromosomal abnormalities, certain single gene, X-linked, and polygenic/multifactorial disorders are associated with recurrent miscarriage. Without further information about the etiology of recurrent pregnancy  loss in the family, precise risk assessment is limited.  The remaining family histories were reviewed and found to be noncontributory for birth defects, intellectual disability, recurrent pregnancy loss, and known genetic conditions. Carolyn Wallace had limited information about her partner's paternal family history; thus, risk assessment was limited.  The patient reported that she and her husband identify with their Native American ancestry. Her family is from the [place] tribe. She was not sure which tribe her partner's family is from. Ashkenazi Jewish ancestry and consanguinity were denied. Pedigree will be scanned under Media.  Discussion  Carolyn Wallace had Horizon-14 carrier screening performed through [place]. 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 Hemoglobin C is a variant form of hemoglobin caused by a specific mutation in the HBB gene. The HBB gene encodes for a protein called beta-globin, which is a subunit of hemoglobin. Hemoglobin within red blood cells binds to oxygen molecules in the lungs and delivers oxygen to tissues throughout the body. There are many different types of hemoglobin, with hemoglobin A being the most common form. Mutations in the HBB gene cause variant forms of hemoglobin to be produced, such as hemoglobin C and hemoglobin S. Individuals with hemoglobin C trait are carriers for hemoglobin C disease (HbC disease), whereas individuals with hemoglobin S trait are carriers for sickle cell disease. Since Carolyn Wallace carries hemoglobin C trait, this indicates that she is a carrier for HbC disease.   Individuals with HbC disease have red blood cells that contain mostly hemoglobin C. An excess of hemoglobin C can cause mild anemia. As a result, individuals with HbC disease may experience symptoms such as weakness, fatigue, and splenomegaly. Carriers of the condition typically do not show any symptoms. HbC disease is inherited in an autosomal  recessive pattern, where both parents must carry hemoglobin C trait to be at risk of  having an affected child. If Carolyn Wallace's partner were also a carrier of HbC disease, they would have a 1 in 4 (25%) chance of having a child with HbC disease.    It is also possible that Carolyn Wallace's partner could carry another variant form of hemoglobin, such as hemoglobin S. If he did, the couple would have a 1 in 4 (25%) chance of having a child with hemoglobin [place] disease (HbSC disease). Individuals with HbSC disease have red blood cells that contain both hemoglobin S and hemoglobin C. These variant forms of hemoglobin can cause red blood cells to become rigid and sickle, blocking small blood vessels and making it difficult for the red blood cells to deliver oxygen to the body's tissues. This can cause severe pain and organ damage, just as we see in individuals with sickle cell disease. Individuals with HbSC disease are at risk of the same complications as those associated with sickle cell disease, such as pain crises, acute chest syndrome, infections, asplenia, and strokes; however, these complications may occur at a lesser frequency than they may in individuals with sickle cell disease.  Finally, Carolyn Wallace's partner may have a different variant in the HBB gene that could make him a carrier of beta-thalassemia. If he did, the couple would have a 1 in 4 (25%) chance of having a child with HbC/beta-thalassemia disease. The severity of HbC/beta-thalassemia depends on the normal amount of beta globin that is produced. If an individual produces no beta globin (beta zero thalassemia), they may experience severe anemia, splenomegaly, and bone changes. If an individual produces a reduced amount of beta globin (beta plus thalassemia), they may experience mild to moderate anemia.  [name] carrier screening was negative for cystic fibrosis (CF) and spinal muscular atrophy (SMA). Thus, her risk to be a carrier for  these additional conditions has been reduced but not eliminated. This also significantly reduces her risk of having a child affected by one of these conditions. We discussed that carrier screening for HbC disease, HbSC disease, and HbC/beta-thalassemia disease is recommended for Ms. Bieri's partner to refine the risks for the current fetus to be affected with one of these conditions. Ms. Kral indicated that she is not interested in pursuing partner carrier screening. She understands that all infants born in [place] are screened for hemoglobinopathies at birth as a part of newborn screening.  We reviewed noninvasive prenatal screening (NIPS) as an available screening option. Specifically, we discussed that NIPS analyzes cell free DNA originating from the placenta that is found in the maternal blood circulation during pregnancy. This test is not diagnostic for chromosome conditions, but can provide information regarding the presence or absence of extra fetal DNA for chromosomes 13, 18, 21, and the sex chromosomes. Thus, it would not identify or rule out all fetal aneuploidy. The reported detection rate is 91-99% for trisomies 21, 18, 13, and sex chromosome aneuploidies. The false positive rate is reported to be less than 0.1% for any of these conditions. Ms. Mateus indicated that she is not interested in undergoing NIPS.  A complete ultrasound was performed today prior to our visit. The ultrasound report will be sent under separate cover. There were no visualized fetal anomalies or markers suggestive of aneuploidy.  Ms. Vierling was also counseled regarding diagnostic testing via amniocentesis. We discussed the technical aspects of the procedure and quoted up to a 1 in 500 (0.2%) risk for spontaneous pregnancy loss or other adverse pregnancy outcomes as a result of amniocentesis. Cultured  cells from an amniocentesis sample allow for the visualization of a fetal karyotype, which can detect  >99% of chromosomal aberrations. Chromosomal microarray can also be performed to identify smaller deletions or duplications of fetal chromosomal material. Amniocentesis could also be performed to assess whether the baby is affected by HbC disease, HbSC disease, and HbC/beta-thalassemia disease. After careful consideration, Ms. Breisch declined amniocentesis at this time. She understands that amniocentesis is available at any point after 16 weeks of pregnancy and that she may opt to undergo the procedure at a later date should she change her mind.  Lastly, screening for open neural tube defects (ONTDs) via MS-AFP in the second trimester in addition to level II ultrasound examination is recommended. Ms. Beltran level II ultrasound did not detect any ONTDs, which is able to detect ONTDs with 90-95% sensitivity. However, normal results from any of the above options do not guarantee a normal baby, as 3-5% of newborns have some type of birth defect, many of which are not prenatally diagnosable.  Additional screening and diagnostic testing were declined today. She understands that screening tests, including ultrasound, cannot rule out all birth defects or genetic syndromes. The patient was advised of this limitation and states she still does not want additional testing or screening at this time.   I counseled Ms. Crespo regarding the above risks and available options. The approximate face-to-face time with the genetic counselor was 25 minutes.  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

## 2020-01-24 ENCOUNTER — Telehealth: Payer: BC Managed Care – PPO | Admitting: Student

## 2020-02-09 ENCOUNTER — Other Ambulatory Visit: Payer: Self-pay

## 2020-02-09 ENCOUNTER — Encounter (HOSPITAL_COMMUNITY): Payer: Self-pay

## 2020-02-09 ENCOUNTER — Other Ambulatory Visit (HOSPITAL_COMMUNITY): Payer: Self-pay | Admitting: *Deleted

## 2020-02-09 ENCOUNTER — Ambulatory Visit (HOSPITAL_COMMUNITY): Payer: BC Managed Care – PPO | Admitting: *Deleted

## 2020-02-09 ENCOUNTER — Ambulatory Visit (HOSPITAL_COMMUNITY)
Admission: RE | Admit: 2020-02-09 | Discharge: 2020-02-09 | Disposition: A | Discharge status: Home / Self Care | Payer: BC Managed Care – PPO | Source: Ambulatory Visit | Attending: Obstetrics and Gynecology | Admitting: Obstetrics and Gynecology

## 2020-02-09 VITALS — BP 102/70 | HR 81 | Temp 97.5°F

## 2020-02-09 DIAGNOSIS — O09219 Supervision of pregnancy with history of pre-term labor, unspecified trimester: Secondary | ICD-10-CM

## 2020-02-09 DIAGNOSIS — Z3480 Encounter for supervision of other normal pregnancy, unspecified trimester: Secondary | ICD-10-CM | POA: Diagnosis present

## 2020-02-09 DIAGNOSIS — O09299 Supervision of pregnancy with other poor reproductive or obstetric history, unspecified trimester: Secondary | ICD-10-CM | POA: Diagnosis not present

## 2020-02-09 DIAGNOSIS — Z362 Encounter for other antenatal screening follow-up: Secondary | ICD-10-CM | POA: Diagnosis not present

## 2020-02-09 DIAGNOSIS — Z3A22 22 weeks gestation of pregnancy: Secondary | ICD-10-CM

## 2020-02-09 DIAGNOSIS — O09212 Supervision of pregnancy with history of pre-term labor, second trimester: Secondary | ICD-10-CM

## 2020-02-09 DIAGNOSIS — O360192 Maternal care for anti-D [Rh] antibodies, unspecified trimester, fetus 2: Secondary | ICD-10-CM

## 2020-03-22 ENCOUNTER — Ambulatory Visit (HOSPITAL_COMMUNITY): Payer: BC Managed Care – PPO | Attending: Obstetrics and Gynecology

## 2020-03-22 ENCOUNTER — Ambulatory Visit (HOSPITAL_COMMUNITY): Payer: BC Managed Care – PPO

## 2020-03-22 ENCOUNTER — Encounter (HOSPITAL_COMMUNITY): Payer: Self-pay

## 2020-04-16 ENCOUNTER — Ambulatory Visit: Payer: BC Managed Care – PPO | Admitting: *Deleted

## 2020-04-16 ENCOUNTER — Other Ambulatory Visit: Payer: Self-pay | Admitting: *Deleted

## 2020-04-16 ENCOUNTER — Ambulatory Visit (HOSPITAL_COMMUNITY): Payer: BC Managed Care – PPO | Attending: Obstetrics and Gynecology

## 2020-04-16 ENCOUNTER — Other Ambulatory Visit: Payer: Self-pay

## 2020-04-16 VITALS — BP 108/67 | HR 70

## 2020-04-16 DIAGNOSIS — Z3A32 32 weeks gestation of pregnancy: Secondary | ICD-10-CM

## 2020-04-16 DIAGNOSIS — O09293 Supervision of pregnancy with other poor reproductive or obstetric history, third trimester: Secondary | ICD-10-CM

## 2020-04-16 DIAGNOSIS — Z3480 Encounter for supervision of other normal pregnancy, unspecified trimester: Secondary | ICD-10-CM | POA: Insufficient documentation

## 2020-04-16 DIAGNOSIS — Z362 Encounter for other antenatal screening follow-up: Secondary | ICD-10-CM

## 2020-04-16 DIAGNOSIS — O0990 Supervision of high risk pregnancy, unspecified, unspecified trimester: Secondary | ICD-10-CM | POA: Insufficient documentation

## 2020-04-16 DIAGNOSIS — O09213 Supervision of pregnancy with history of pre-term labor, third trimester: Secondary | ICD-10-CM

## 2020-04-16 DIAGNOSIS — O360130 Maternal care for anti-D [Rh] antibodies, third trimester, not applicable or unspecified: Secondary | ICD-10-CM

## 2020-04-16 DIAGNOSIS — O09219 Supervision of pregnancy with history of pre-term labor, unspecified trimester: Secondary | ICD-10-CM | POA: Diagnosis not present

## 2020-05-10 ENCOUNTER — Inpatient Hospital Stay (EMERGENCY_DEPARTMENT_HOSPITAL)
Admission: AD | Admit: 2020-05-10 | Discharge: 2020-05-10 | Disposition: A | Discharge status: Home / Self Care | Payer: BC Managed Care – PPO | Source: Ambulatory Visit | Attending: Family Medicine | Admitting: Family Medicine

## 2020-05-10 ENCOUNTER — Telehealth: Payer: Self-pay | Admitting: General Practice

## 2020-05-10 ENCOUNTER — Encounter (HOSPITAL_COMMUNITY): Payer: Self-pay | Admitting: Family Medicine

## 2020-05-10 ENCOUNTER — Other Ambulatory Visit: Payer: Self-pay

## 2020-05-10 DIAGNOSIS — O09213 Supervision of pregnancy with history of pre-term labor, third trimester: Secondary | ICD-10-CM | POA: Diagnosis not present

## 2020-05-10 DIAGNOSIS — Z3A35 35 weeks gestation of pregnancy: Secondary | ICD-10-CM

## 2020-05-10 DIAGNOSIS — O479 False labor, unspecified: Secondary | ICD-10-CM

## 2020-05-10 DIAGNOSIS — O4703 False labor before 37 completed weeks of gestation, third trimester: Secondary | ICD-10-CM | POA: Insufficient documentation

## 2020-05-10 DIAGNOSIS — O0933 Supervision of pregnancy with insufficient antenatal care, third trimester: Secondary | ICD-10-CM | POA: Insufficient documentation

## 2020-05-10 DIAGNOSIS — O26893 Other specified pregnancy related conditions, third trimester: Secondary | ICD-10-CM | POA: Insufficient documentation

## 2020-05-10 DIAGNOSIS — R109 Unspecified abdominal pain: Secondary | ICD-10-CM | POA: Insufficient documentation

## 2020-05-10 DIAGNOSIS — Z3480 Encounter for supervision of other normal pregnancy, unspecified trimester: Secondary | ICD-10-CM

## 2020-05-10 DIAGNOSIS — O4700 False labor before 37 completed weeks of gestation, unspecified trimester: Secondary | ICD-10-CM | POA: Diagnosis present

## 2020-05-10 DIAGNOSIS — O0930 Supervision of pregnancy with insufficient antenatal care, unspecified trimester: Secondary | ICD-10-CM

## 2020-05-10 LAB — URINALYSIS, ROUTINE W REFLEX MICROSCOPIC
Bilirubin Urine: NEGATIVE
Glucose, UA: NEGATIVE mg/dL
Hgb urine dipstick: NEGATIVE
Ketones, ur: NEGATIVE mg/dL
Leukocytes,Ua: NEGATIVE
Nitrite: NEGATIVE
Protein, ur: NEGATIVE mg/dL
Specific Gravity, Urine: 1.015 (ref 1.005–1.030)
pH: 7 (ref 5.0–8.0)

## 2020-05-10 NOTE — Telephone Encounter (Signed)
-----   Message from York Ram sent at 05/10/2020  8:41 AM EDT ----- Regarding: FW: needs appt  ----- Message ----- From: Aviva Signs, CNM Sent: 05/10/2020   6:40 AM EDT To: [facility] Admin Pool Subject: needs appt                                     Has not been to OB appt since Feb Missed 2nd appt because she was going to transfer to [facility] but couldn't Has been seeing MFM regularly for [place] and thought they were the [facility] provider  She did not realize that she was not seeing the OB I did her GBS here in [facility] but she needs weekly visits now  Is 35 weeks  Thanks Hilda Lias

## 2020-05-10 NOTE — Telephone Encounter (Signed)
Called patient to schedule follow up visit.  Left message on VM for patient to give our office a call to schedule follow up appt.

## 2020-05-10 NOTE — MAU Note (Signed)
Contractions since 0200. Denies LOF or VB

## 2020-05-10 NOTE — Discharge Instructions (Signed)

## 2020-05-10 NOTE — MAU Provider Note (Signed)
Chief Complaint:  Contractions   First Provider Initiated Contact with Patient 05/10/20 0541     HPI: Carolyn Wallace is a 29 y.o. G2P0101 at 45w4dwho presents to maternity admissions reporting contractions since 0200.  She reports good fetal movement, denies LOF, vaginal bleeding, vaginal itching/burning, urinary symptoms, h/a, dizziness, n/v, diarrhea, constipation or fever/chills.  She denies headache, visual changes or RUQ abdominal pain.  Abdominal Pain This is a new problem. The current episode started today. The problem occurs intermittently. The problem has been unchanged. The pain is moderate. The quality of the pain is cramping. The abdominal pain does not radiate. Pertinent negatives include no constipation, diarrhea, dysuria, fever, headaches, myalgias, nausea or vomiting. Nothing aggravates the pain. The pain is relieved by nothing. She has tried nothing for the symptoms.    Past Medical History: No past medical history on file.  Past obstetric history: OB History  Gravida Para Term Preterm AB Living  2 1 0 1 0 1  SAB TAB Ectopic Multiple Live Births  0 0 0 0 1    # Outcome Date GA Lbr Len/2nd Weight Sex Delivery Anes PTL Lv  2 Current           1 Preterm 11/20/18 [redacted]w[redacted]d 02:11 / 00:11 2257 g F Vag-Spont None  LIV     Birth Comments: WNL    Past Surgical History: Past Surgical History:  Procedure Laterality Date  . APPENDECTOMY  2007    Family History: Family History  Problem Relation Age of Onset  . Crohn's disease Paternal Uncle     Social History: Social History   Tobacco Use  . Smoking status: Never Smoker  . Smokeless tobacco: Never Used  Vaping Use  . Vaping Use: Never used  Substance Use Topics  . Alcohol use: No  . Drug use: No    Allergies: No Known Allergies  Meds:  Medications Prior to Admission  Medication Sig Dispense Refill Last Dose  . Blood Pressure Monitoring (BLOOD PRESSURE MONITOR AUTOMAT) DEVI 1 Device by Does not apply route  daily. Automatic blood pressure cuff regular size. To monitor blood pressure regularly at home. ICD-10 code: O09.90. 1 each 0   . Prenatal MV & Min w/FA-DHA (ONE A DAY PRENATAL) 0.4-25 MG CHEW Chew 2 each by mouth daily.     Marland Kitchen terconazole (TERAZOL 3) 0.8 % vaginal cream Place 1 applicator vaginally at bedtime. (Patient not taking: Reported on 01/11/2020) 20 g 0     I have reviewed patient's Past Medical Hx, Surgical Hx, Family Hx, Social Hx, medications and allergies.   ROS:  Review of Systems  Constitutional: Negative for fever.  Gastrointestinal: Positive for abdominal pain. Negative for constipation, diarrhea, nausea and vomiting.  Genitourinary: Negative for dysuria.  Musculoskeletal: Negative for myalgias.  Neurological: Negative for headaches.   Other systems negative  Physical Exam   Patient Vitals for the past 24 hrs:  BP Temp Pulse Resp Height Weight  05/10/20 0522 100/71 -- 96 -- -- --  05/10/20 0520 -- (!) 97.5 F (36.4 C) -- 18 5\' 4"  (1.626 m) 57.6 kg   Constitutional: Well-developed, well-nourished female in no acute distress.  Cardiovascular: normal rate and rhythm Respiratory: normal effort, clear to auscultation bilaterally GI: Abd soft, non-tender, gravid appropriate for gestational age.   No rebound or guarding. MS: Extremities nontender, no edema, normal ROM Neurologic: Alert and oriented x 4.  GU: Neg CVAT.  PELVIC EXAM:  Dilation: 3 Effacement (%): 80 Cervical Position: Posterior Station: -2  Presentation: Vertex Exam by:: Brindle Leyba, CNM   FHT:  Baseline 135 , moderate variability, accelerations present, no decelerations Contractions: q 4-5 mins Irregular    Labs: Results for orders placed or performed during the hospital encounter of 05/10/20 (from the past 24 hour(s))  Urinalysis, Routine w reflex microscopic     Status: None   Collection Time: 05/10/20  5:30 AM  Result Value Ref Range   Color, Urine YELLOW YELLOW   APPearance CLEAR CLEAR   Specific  Gravity, Urine 1.015 1.005 - 1.030   pH 7.0 5.0 - 8.0   Glucose, UA NEGATIVE NEGATIVE mg/dL   Hgb urine dipstick NEGATIVE NEGATIVE   Bilirubin Urine NEGATIVE NEGATIVE   Ketones, ur NEGATIVE NEGATIVE mg/dL   Protein, ur NEGATIVE NEGATIVE mg/dL   Nitrite NEGATIVE NEGATIVE   Leukocytes,Ua NEGATIVE NEGATIVE   O/Negative/-- (01/11 1007)  Imaging:    MAU Course/MDM: I have ordered labs and reviewed results. Urine is clear.  GBS and cultures sent since she has not had prenatal visits since Feb. NST reviewed, reactive with regular UCs.  Recommended tocolysis, and she refused.  States her other baby was fine at 36 weeks and she does not want to stop contractions.  Agrees to stay for an hour for recheck.   After 45 min states they are barely felt and she wants to go home.  Recheck is unchanged  Reviewed charts as pt states she has been going for prenatal visits.  Had new OB in  February.  Cancelled her next appt because she wanted to transfer to [place].  She was unable to get to [place].  She never rescheduled.  However she kept her [place] appt and has had several of those to monitor for Fetal Growth. She thought these were OB appointments.  Has not had Glucola, screening or cultures.  I sent messages to get her in. I did her GBS and cultures just in case.   Assessment: S[place]gle IUP at [redacted]w[redacted]d Preterm uterine contractions with dilation of cervix but no active change Hx PT delivery Hx FGR prior baby  Plan: Discharge home Preterm Labor precautions and fetal kick counts Follow up in Office for prenatal visits and recheck of cervix Message sent re: insufficient PN care Encouraged to return here or to other Urgent Care/ED if she develops worsening of symptoms, increase in pain, fever, or other concerning symptoms.  Pt stable at time of discharge.  Hansel Feinstein CNM, MSN Certified Nurse-Midwife 05/10/2020 5:42 AM

## 2020-05-11 ENCOUNTER — Inpatient Hospital Stay (HOSPITAL_COMMUNITY)
Admission: AD | Admit: 2020-05-11 | Discharge: 2020-05-13 | DRG: 807 | Disposition: A | Discharge status: Home / Self Care | Payer: BC Managed Care – PPO | Attending: Obstetrics & Gynecology | Admitting: Obstetrics & Gynecology

## 2020-05-11 ENCOUNTER — Encounter: Payer: Self-pay | Admitting: Family Medicine

## 2020-05-11 ENCOUNTER — Other Ambulatory Visit: Payer: Self-pay

## 2020-05-11 DIAGNOSIS — Z3A35 35 weeks gestation of pregnancy: Secondary | ICD-10-CM | POA: Diagnosis not present

## 2020-05-11 DIAGNOSIS — O26893 Other specified pregnancy related conditions, third trimester: Secondary | ICD-10-CM | POA: Diagnosis present

## 2020-05-11 DIAGNOSIS — Z20822 Contact with and (suspected) exposure to covid-19: Secondary | ICD-10-CM | POA: Diagnosis present

## 2020-05-11 DIAGNOSIS — Z8751 Personal history of pre-term labor: Secondary | ICD-10-CM

## 2020-05-11 DIAGNOSIS — Z6791 Unspecified blood type, Rh negative: Secondary | ICD-10-CM

## 2020-05-11 DIAGNOSIS — O0930 Supervision of pregnancy with insufficient antenatal care, unspecified trimester: Secondary | ICD-10-CM

## 2020-05-11 DIAGNOSIS — D582 Other hemoglobinopathies: Secondary | ICD-10-CM | POA: Diagnosis present

## 2020-05-11 DIAGNOSIS — O368330 Maternal care for abnormalities of the fetal heart rate or rhythm, third trimester, not applicable or unspecified: Secondary | ICD-10-CM | POA: Diagnosis not present

## 2020-05-11 DIAGNOSIS — O3442 Maternal care for other abnormalities of cervix, second trimester: Secondary | ICD-10-CM | POA: Diagnosis present

## 2020-05-11 DIAGNOSIS — O26899 Other specified pregnancy related conditions, unspecified trimester: Secondary | ICD-10-CM

## 2020-05-11 LAB — SARS CORONAVIRUS 2 BY RT PCR (HOSPITAL ORDER, PERFORMED IN ~~LOC~~ HOSPITAL LAB): SARS Coronavirus 2: NEGATIVE

## 2020-05-11 MED ORDER — OXYTOCIN-SODIUM CHLORIDE 30-0.9 UT/500ML-% IV SOLN: 2.5000 [IU]/h | INTRAVENOUS | Status: DC

## 2020-05-11 MED ORDER — LACTATED RINGERS IV SOLN: 500.0000 mL | INTRAVENOUS | Status: DC | PRN

## 2020-05-11 MED ORDER — OXYTOCIN BOLUS FROM INFUSION: 500.0000 mL | Freq: Once | INTRAVENOUS | Status: DC

## 2020-05-11 MED ORDER — SOD CITRATE-CITRIC ACID 500-334 MG/5ML PO SOLN: 30.0000 mL | ORAL | Status: DC | PRN

## 2020-05-11 MED ORDER — WITCH HAZEL-GLYCERIN EX PADS: 1.0000 "application " | MEDICATED_PAD | CUTANEOUS | Status: DC | PRN

## 2020-05-11 MED ORDER — ACETAMINOPHEN 325 MG PO TABS
650.0000 mg | ORAL_TABLET | ORAL | Status: DC | PRN
  Administered 2020-05-11 (×2): 650 mg via ORAL
  Filled 2020-05-11 (×2): qty 2

## 2020-05-11 MED ORDER — ONDANSETRON HCL 4 MG/2ML IJ SOLN: 4.0000 mg | INTRAMUSCULAR | Status: DC | PRN

## 2020-05-11 MED ORDER — COCONUT OIL OIL
1.0000 "application " | TOPICAL_OIL | Status: DC | PRN
  Administered 2020-05-12: 1 via TOPICAL

## 2020-05-11 MED ORDER — BENZOCAINE-MENTHOL 20-0.5 % EX AERO: 1.0000 "application " | INHALATION_SPRAY | CUTANEOUS | Status: DC | PRN

## 2020-05-11 MED ORDER — ONDANSETRON HCL 4 MG PO TABS: 4.0000 mg | ORAL_TABLET | ORAL | Status: DC | PRN

## 2020-05-11 MED ORDER — IBUPROFEN 600 MG PO TABS
600.0000 mg | ORAL_TABLET | Freq: Four times a day (QID) | ORAL | Status: DC
  Administered 2020-05-11 – 2020-05-13 (×9): 600 mg via ORAL
  Filled 2020-05-11 (×9): qty 1

## 2020-05-11 MED ORDER — SENNOSIDES-DOCUSATE SODIUM 8.6-50 MG PO TABS
2.0000 | ORAL_TABLET | ORAL | Status: DC
  Administered 2020-05-11 – 2020-05-12 (×2): 2 via ORAL
  Filled 2020-05-11 (×2): qty 2

## 2020-05-11 MED ORDER — OXYCODONE-ACETAMINOPHEN 5-325 MG PO TABS: 1.0000 | ORAL_TABLET | ORAL | Status: DC | PRN

## 2020-05-11 MED ORDER — LIDOCAINE HCL (PF) 1 % IJ SOLN: 30.0000 mL | INTRAMUSCULAR | Status: DC | PRN

## 2020-05-11 MED ORDER — LACTATED RINGERS IV SOLN: INTRAVENOUS | Status: DC

## 2020-05-11 MED ORDER — ONDANSETRON HCL 4 MG/2ML IJ SOLN: 4.0000 mg | Freq: Four times a day (QID) | INTRAMUSCULAR | Status: DC | PRN

## 2020-05-11 MED ORDER — ACETAMINOPHEN 325 MG PO TABS: 650.0000 mg | ORAL_TABLET | ORAL | Status: DC | PRN

## 2020-05-11 MED ORDER — OXYCODONE-ACETAMINOPHEN 5-325 MG PO TABS: 2.0000 | ORAL_TABLET | ORAL | Status: DC | PRN

## 2020-05-11 MED ORDER — SIMETHICONE 80 MG PO CHEW: 80.0000 mg | CHEWABLE_TABLET | ORAL | Status: DC | PRN

## 2020-05-11 MED ORDER — OXYTOCIN 10 UNIT/ML IJ SOLN
INTRAMUSCULAR | Status: AC
  Filled 2020-05-11: qty 1

## 2020-05-11 MED ORDER — PRENATAL MULTIVITAMIN CH
1.0000 | ORAL_TABLET | Freq: Every day | ORAL | Status: DC
  Administered 2020-05-11 – 2020-05-13 (×3): 1 via ORAL
  Filled 2020-05-11 (×3): qty 1

## 2020-05-11 MED ORDER — ZOLPIDEM TARTRATE 5 MG PO TABS: 5.0000 mg | ORAL_TABLET | Freq: Every evening | ORAL | Status: DC | PRN

## 2020-05-11 MED ORDER — TETANUS-DIPHTH-ACELL PERTUSSIS 5-2.5-18.5 LF-MCG/0.5 IM SUSP: 0.5000 mL | Freq: Once | INTRAMUSCULAR | Status: DC

## 2020-05-11 MED ORDER — DIPHENHYDRAMINE HCL 25 MG PO CAPS: 25.0000 mg | ORAL_CAPSULE | Freq: Four times a day (QID) | ORAL | Status: DC | PRN

## 2020-05-11 MED ORDER — DIBUCAINE (PERIANAL) 1 % EX OINT: 1.0000 "application " | TOPICAL_OINTMENT | CUTANEOUS | Status: DC | PRN

## 2020-05-11 NOTE — MAU Note (Signed)
Called to lobby to get patient from car. Pt taken to room 120. Reports contractions every 4-5 mins. No LOF or vag bleeding. Good fetal movement. Cervix 3cm yesterday.

## 2020-05-11 NOTE — Progress Notes (Signed)
Attempted to complete patients second 4 hour check at 2030 but FOB requested I let her sleep. We agreed that I would come back in one hour to complete assessment.   [name] 8:45 PM 05/11/2020

## 2020-05-11 NOTE — H&P (Signed)
Carolyn Wallace is a 29 y.o. female presenting for active labor (preterm)..  She had been seen this week for preterm labor and refused tocolysis.  Pregnancy has been followed with one PNV at [facility] but no further ones.  She had planned to switch to [facility] but never went   Did have several MFM ultrasounds but no further prenatal care.  Patient Active Problem List   Diagnosis Date Noted  . Preterm labor in third trimester with preterm delivery 05/11/2020  . Preterm labor 05/11/2020  . Vacuum-assisted vaginal delivery 05/11/2020  . Insufficient prenatal care 05/10/2020  . History of preterm delivery 01/03/2020  . Abnormality of cervix during pregnancy in second trimester 01/03/2020  . Rh negative status during pregnancy 01/03/2020  . 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 Supervision of other normal pregnancy, antepartum 12/11/2019    RN Note: Called to lobby to get patient from car. Pt taken to room 120. Reports contractions every 4-5 mins. No LOF or vag bleeding. Good fetal movement. Cervix 3cm yesterday.  OB History    Gravida  2   Para  1   Term  0   Preterm  1   AB  0   Living  1     SAB  0   TAB  0   Ectopic  0   Multiple  0   Live Births  1          History reviewed. No pertinent past medical history. Past Surgical History:  Procedure Laterality Date  . APPENDECTOMY  2007   Family History: family history includes Crohn's disease in her paternal uncle. Social History:  reports that she has never smoked. She has never used smokeless tobacco. She reports that she does not drink alcohol and does not use drugs.     Maternal Diabetes: No Genetic Screening: Declined Maternal Ultrasounds/Referrals: Normal Fetal Ultrasounds or other Referrals:  None Maternal Substance Abuse:  No Significant Maternal Medications:  None Significant Maternal Lab Results:  None Other Comments:  None  Review of Systems  Constitutional: Negative for chills and fever.   Respiratory: Negative for shortness of breath.   Gastrointestinal: Positive for abdominal pain. Negative for constipation, diarrhea and nausea.  Genitourinary: Positive for pelvic pain. Negative for dysuria, vaginal bleeding and vaginal discharge.  Neurological: Negative for dizziness and weakness.   Maternal Medical History:  Reason for admission: Contractions.  Nausea.  Contractions: Onset was 1-2 hours ago.   Frequency: regular.   Perceived severity is strong.    Fetal activity: Perceived fetal activity is normal.    Prenatal complications: Preterm labor.   No bleeding, PIH, infection or pre-eclampsia.   Prenatal Complications - Diabetes: none.    Dilation: Lip/rim Exam by:: Karl Ito, RN Last menstrual period 09/04/2019, unknown if currently breastfeeding. Maternal Exam:  Uterine Assessment: Contraction strength is firm.  Contraction frequency is regular.   Abdomen: Patient reports no abdominal tenderness. Fetal presentation: vertex  Introitus: Normal vulva. Normal vagina.  Ferning test: not done.  Nitrazine test: not done. Amniotic fluid character: not assessed.  Pelvis: adequate for delivery.   Cervix: Cervix evaluated by digital exam.     Fetal Exam Fetal Monitor Review: Mode: ultrasound.   Baseline rate: 120.  Variability: moderate (6-25 bpm).   Pattern: accelerations present and variable decelerations.    Fetal State Assessment: Category I - tracings are normal.     Physical Exam  Vitals reviewed. Constitutional: No distress.  HENT:  Head: Normocephalic.  Cardiovascular: Normal  rate, regular rhythm and normal pulses.  Respiratory: Effort normal. No respiratory distress.  GI: Soft. Normal appearance. She exhibits no distension. There is no abdominal tenderness. There is no guarding.  Genitourinary:    Vulva normal.   Musculoskeletal:        General: Normal range of motion.     Cervical back: Normal range of motion and neck supple.   Neurological: She is alert.  Skin: Skin is warm and dry.  Psychiatric: Mood normal.    Prenatal labs: ABO, Rh: O/Negative/-- (01/11 1007) Antibody: Negative (01/11 1007) Rubella: 3.93 (01/11 1007) RPR: Non Reactive (01/11 1007)  HBsAg: Negative (01/11 1007)  HIV: Non Reactive (01/11 1007)  GBS:     Assessment/Plan: SIngle IUP at [redacted]w[redacted]d Active Labor, preterm Limited prenatal care  Admit to Labor and Delivery Routine orders Prepare for delivery   Hansel Feinstein 05/11/2020, 8:00 AM

## 2020-05-11 NOTE — Lactation Note (Signed)
This note was copied from a baby's chart. Lactation Consultation Note  Patient Name: Carolyn Wallace [id number] Date: 05/11/2020 Reason for consult: Initial assessment;Infant < 6lbs;Late-preterm 34-36.6wks  LC in to visit with P2 Mom of LPTI at 10 hrs old.  Baby born at [redacted]w[redacted]d weighing 5 lbs 9.6 oz.  Mom's first baby was [redacted] weeks gestation, and she breastfed baby for a year.   Mom had one prenatal visit, and had refused lactation support initially.  Baby has breastfed 4 times, RN assessed latch for an 8, last feeding audible swallowing noted.  Baby has voided twice, no stools yet.   Baby's temperature dropped and is in CN under heat shield.   Assisted Mom to pump on initiation setting for 15 mins.  Mom has small breasts and large, erect nipples.  27 mm flange for right breast and 24 mm flange on left side a good fit currently.  FOB taught how to disassemble pump parts, wash, rinse and air dry in separate bin provided.  Encouraged Mom to keep baby STS as much as possible, offering breast at least every 3 hrs or sooner with cues.  Encouraged hand expression into a spoon, along with double pumping for 15 mins and feeding baby EBM per LPTI guidelines.  Parents made aware of donor breast milk to supplement baby if EBM volume low.    Parents aware of probable increased sleepiness tomorrow and the next few days, and need to consistently be pumping to support a full milk supply.  Talked about what would be considered a good breastfeeding, >10 mins of rhythmic sucking and swallowing and at least every 3 hrs. Parents voiced understanding.  Supplementation guidelines- 1-24 hrs- 5-10 ml 24-48 hrs-10-20 ml increasing as tolerated.    Lactation brochure left in room.  Parents aware of lactation support and encouraged them to ask for help prn.   Maternal Data Formula Feeding for Exclusion: No Has patient been taught Hand Expression?: Yes Does the patient have breastfeeding experience prior to this  delivery?: Yes  Feeding Feeding Type: Breast Fed  LATCH Score Latch: Grasps breast easily, tongue down, lips flanged, rhythmical sucking.  Audible Swallowing: A few with stimulation  Type of Nipple: Everted at rest and after stimulation  Comfort (Breast/Nipple): Soft / non-tender  Hold (Positioning): Assistance needed to correctly position infant at breast and maintain latch.  LATCH Score: 8  Interventions Interventions: Breast feeding basics reviewed;Skin to skin;Breast massage;Hand express;DEBP  Lactation Tools Discussed/Used Tools: Pump;Flanges Flange Size: 24;27 (right breast 27 mm, left breast 24 mm) Breast pump type: Double-Electric Breast Pump WIC Program: No Pump Review: Setup, frequency, and cleaning;Milk Storage Initiated by:: Erby Pian RN IBCLC Date initiated:: 05/11/20   Consult Status Consult Status: Follow-up Date: 05/12/20 Follow-up type: In-patient    [name] 05/11/2020, 5:33 PM

## 2020-05-11 NOTE — Discharge Summary (Signed)
Postpartum Discharge Summary     Patient Name: Carolyn Wallace DOB: [birth year] MRN: [id number]  Date of admission: 05/11/2020 Delivery date:05/11/2020  Delivering provider: Seabron Spates  Date of discharge: 05/13/2020  Admitting diagnosis: Preterm labor [O60.00] Intrauterine pregnancy: [redacted]w[redacted]d    Secondary diagnosis:  Principal Problem:   Preterm labor in third trimester with preterm delivery Active Problems:   Hemoglobin C trait ([place]   History of preterm delivery   Abnormality of cervix during pregnancy in second trimester   Rh negative status during pregnancy   Insufficient prenatal care   Preterm labor   Vacuum-assisted vaginal delivery  Additional problems: preterm labor    Discharge diagnosis: Preterm Pregnancy Delivered                                              Post partum procedures:None Augmentation: N/A Complications: None  Hospital course: Onset of Labor With Vaginal Delivery      29y.o. yo G2P0101 at 364w5das admitted in Active Labor on 05/11/2020. Patient had an uncomplicated labor course as follows:  Membrane Rupture Time/Date: 7:06 AM ,05/11/2020   Delivery Method:Vaginal, Vacuum (Extractor)  Episiotomy: None  Lacerations:  None  Patient had an uncomplicated postpartum course.  She is ambulating, tolerating a regular diet, passing flatus, and urinating well. Patient is discharged home in stable condition on 05/13/20.  Newborn Data: Birth date:05/11/2020  Birth time:7:25 AM  Gender:Female  Living status:  Apgars:8 ,9  Weight:2540 g   Magnesium Sulfate received: No BMZ received: No Rhophylac:No MMR:No T-DaP:questionable Flu: No Transfusion:No  Physical exam  Vitals:   05/12/20 0602 05/12/20 1429 05/12/20 2139 05/13/20 0610  BP: 98/68 93/61 106/71 108/75  Pulse: 73 68 82 64  Resp: _0 Temp: 97.9 F (36.6 C) 98 F (36.7 C) 98.4 F (36.9 C) 98.2 F (36.8 C)  TempSrc: Oral Oral Oral Oral  SpO2: 99% 100%  99%  Weight:       Height:       General: alert, cooperative and no distress Lochia: appropriate Uterine Fundus: firm Incision: N/A DVT Evaluation: No evidence of DVT seen on physical exam. Labs: Lab Results  Component Value Date   WBC 11.3 (H) 05/12/2020   HGB 11.1 (L) 05/12/2020   HCT 32.9 (L) 05/12/2020   MCV 74.8 (L) 05/12/2020   PLT 110 (L) 05/12/2020   No flowsheet data found. Edinburgh Score: Edinburgh Postnatal Depression Scale Screening Tool 05/12/2020  I have been able to laugh and see the funny side of things. 0  I have looked forward with enjoyment to things. 0  I have blamed myself unnecessarily when things went wrong. 0  I have been anxious or worried for no good reason. 0  I have felt scared or panicky for no good reason. 0  Things have been getting on top of me. 0  I have been so unhappy that I have had difficulty sleeping. 0  I have felt sad or miserable. 0  I have been so unhappy that I have been crying. 0  The thought of harming myself has occurred to me. 0  Edinburgh Postnatal Depression Scale Total 0      After visit meds:  Allergies as of 05/13/2020   No Known Allergies     Medication List    STOP taking these medications  terconazole 0.8 % vaginal cream Commonly known as: TERAZOL 3     TAKE these medications   acetaminophen 325 MG tablet Commonly known as: Tylenol Take 2 tablets (650 mg total) by mouth every 4 (four) hours as needed (for pain scale < 4).   Blood Pressure Monitor Automat Devi 1 Device by Does not apply route daily. Automatic blood pressure cuff regular size. To monitor blood pressure regularly at home. ICD-10 code: O09.90.   ibuprofen 600 MG tablet Commonly known as: ADVIL Take 1 tablet (600 mg total) by mouth every 6 (six) hours.   One A Day Prenatal 0.4-25 MG Chew Chew 2 each by mouth daily.   senna-docusate 8.6-50 MG tablet Commonly known as: Senokot-S Take 2 tablets by mouth daily. Start taking on: May 14, 2020        Please schedule this patient for Postpartum visit in: 4 weeks with the following provider: Any provider In-Person For C/S patients schedule nurse incision check in weeks 2 weeks: no Low risk pregnancy complicated by: preterm labor Delivery mode:  Vacuum Anticipated Birth Control:  other/unsure PP Procedures needed: none  Schedule Integrated [place] visit: no      Discharge home in stable condition Infant Feeding: Breast Infant Disposition:home with mother Discharge instruction: per After Visit Summary and Postpartum booklet. Activity: Advance as tolerated. Pelvic rest for 6 weeks.  Diet: routine diet Anticipated Birth Control: Unsure Postpartum Appointment:4 weeks Additional Postpartum F/U: None Future Appointments: Future Appointments  Date Time Provider [place]  05/15/2020  8:45 AM [facility] NURSE [facility]  05/15/2020  8:45 AM [facility] US4 [facility] [place]   Follow up Visit:      05/13/2020 Chauncey Mann, MD

## 2020-05-12 LAB — CULTURE, BETA STREP (GROUP B ONLY)

## 2020-05-12 LAB — CBC
HCT: 32.9 % — ABNORMAL LOW (ref 36.0–46.0)
Hemoglobin: 11.1 g/dL — ABNORMAL LOW (ref 12.0–15.0)
MCH: 25.2 pg — ABNORMAL LOW (ref 26.0–34.0)
MCHC: 33.7 g/dL (ref 30.0–36.0)
MCV: 74.8 fL — ABNORMAL LOW (ref 80.0–100.0)
Platelets: 110 10*3/uL — ABNORMAL LOW (ref 150–400)
RBC: 4.4 MIL/uL (ref 3.87–5.11)
RDW: 13.4 % (ref 11.5–15.5)
WBC: 11.3 10*3/uL — ABNORMAL HIGH (ref 4.0–10.5)
nRBC: 0 % (ref 0.0–0.2)

## 2020-05-12 LAB — RPR: RPR Ser Ql: NONREACTIVE

## 2020-05-12 NOTE — Progress Notes (Signed)
Post Partum Day 1 Subjective: Patient reports feeling well. She is tolerating PO. Ambulating and urinating without difficulty. Lochia minimal.  Objective: Blood pressure 107/66, pulse 66, temperature 97.9 F (36.6 C), temperature source Oral, resp. rate 18, height 5\' 4"  (1.626 m), weight 54.4 kg, SpO2 100 %, unknown if currently breastfeeding.  Physical Exam:  General: alert, cooperative and no distress Lochia: appropriate Uterine Fundus: firm Incision: NA DVT Evaluation: No evidence of DVT seen on physical exam.  No results for input(s): HGB, HCT in the last 72 hours.  Assessment/Plan: Plan for discharge tomorrow. Okay to discharge today if baby can; RN to page team for orders. (GBS unknown and baby 35w therefore unlikely). Breastfeeding CBC was ordered for this morning as one was not done on admission; Hgb 12.09 Dec 2019 Undecided about contraception; further discuss post-partum Plan for circ today or tomorrow    LOS: 1 day   11 Dec 2019 05/12/2020, 5:28 AM

## 2020-05-12 NOTE — Clinical Social Work Maternal (Signed)
CLINICAL SOCIAL WORK MATERNAL/CHILD NOTE  Patient Details  Name: Carolyn Wallace MRN: [id number] Date of Birth: 04/04/1991  Date:  05/12/2020  Clinical Social Worker Initiating Note:  Reed Eifert, MSW, LCSWA Date/Time: Initiated:  05/12/20/1400     Child's Name:  Carolyn Wallace   Biological Parents:  Mother, Father (FOB, Carolyn C. Smithhart Sr., 08/08/1992, [phone])   Need for Interpreter:  None   Reason for Referral:  Late or No Prenatal Care    Address:  [address] [place] [address]    Phone number:  [phone] (home)     Additional phone number: [phone]  Household Members/Support Persons (HM/SP):   Household Member/Support Person 1, Household Member/Support Person 2   HM/SP Name Relationship DOB or Age  HM/SP -1 Carolyn C. Choung Sr. FOB 08-08-1992  HM/SP -2 Carolyn Wallace daughter 11/20/2018  HM/SP -3        HM/SP -4        HM/SP -5        HM/SP -6        HM/SP -7        HM/SP -8          Natural Supports (not living in the home):  Parent, Church   Professional Supports:     Employment: Unemployed   Type of Work:     Education:  Vocation/technical training   Homebound arranged:    Financial Resources:  Private Insurance   Other Resources:  WIC   Cultural/Religious Considerations Which May Impact Care: none stated  Strengths:  Ability to meet basic needs , Pediatrician chosen, Home prepared for child    Psychotropic Medications:         Pediatrician:    [place] area  Pediatrician List:   [facility] ([address])  [place]    [place]    [place]    [place]    [place]      Pediatrician Fax Number:    Risk Factors/Current Problems:  None   Cognitive State:  Able to Concentrate , Alert    Mood/Affect:  Interested , Calm , Happy , Relaxed    CSW Assessment: CSW received consult for limited and late prenatal care. CSW met  with MOB at bedside to offer support and complete assessment.  On arrival, CSW introduce self and stated purpose for visit. Infant, Carolyn Wallace was present and being held by MOB. MOB was pleasant and engaged during visit.  MOB explained late prenatal care was related to scheduling conflicts after request to transfer to [facility] after 17 wks initial appointment at [facility]. MOB stated she called several times to [facility] but could not get scheduled and when she called [facility] weeks later she was told they did not have any available appointments. MOB denies any financial or transportation concerns with getting infant to peds appointments at [facility].  MOB denied any BH hx or dx. MOB denied any postpartum depression or anxiety history. MOB denied and SI, HI, or domestic violence.  CSW informed MOB of hospital's infant drug screen policy, infant's negative UDS results, pending CDS results, and CPS report if warranted. MOB stated understanding and denied any questions. CSW assessed for CPS history. MOB denied any CPS history.   CSW provided education regarding the baby blues period vs. perinatal mood disorders, discussed treatment and gave resources for mental health follow up if concerns arise.  CSW recommends self-evaluation during the   postpartum time period using the New Mom Checklist from Postpartum Progress and encouraged MOB to contact a medical professional if symptoms are noted at any time. MOB agreed and denied any questions.   CSW provided review of Sudden Infant Death Syndrome (SIDS) precautions. MOB stated understanding and denied any questions. MOB confirmed having all needed items for baby including car seat and cib and bassinet for baby's safe sleep.    CSW identifies no further need for intervention and no barriers to discharge at this time. CSW will continue to monitor CDS results and contact CPS for report if warranted.   CSW Plan/Description:  No Further  Intervention Required/No Barriers to Discharge, Sudden Infant Death Syndrome (SIDS) Education, Perinatal Mood and Anxiety Disorder (PMADs) Education, Hospital Drug Screen Policy Information, CSW Will Continue to Monitor Umbilical Cord Tissue Drug Screen Results and Make Report if Warranted   Carolyn Wallace D. Pascuala Klutts, MSW, LCSWA Clinical Social Worker [phone] 05/12/2020, 5:22 PM 

## 2020-05-13 ENCOUNTER — Encounter (HOSPITAL_COMMUNITY): Payer: Self-pay | Admitting: Obstetrics & Gynecology

## 2020-05-13 MED ORDER — SENNOSIDES-DOCUSATE SODIUM 8.6-50 MG PO TABS: 2.0000 | ORAL_TABLET | ORAL | 0 refills | Status: DC

## 2020-05-13 MED ORDER — IBUPROFEN 600 MG PO TABS: 600.0000 mg | ORAL_TABLET | Freq: Four times a day (QID) | ORAL | 0 refills | Status: DC

## 2020-05-13 MED ORDER — ACETAMINOPHEN 325 MG PO TABS: 650.0000 mg | ORAL_TABLET | ORAL | 0 refills | Status: DC | PRN

## 2020-05-13 NOTE — Lactation Note (Signed)
This note was copied from a baby's chart. Lactation Consultation Note Attempted to see mom. Everyone in rm. Asleep.  Will ask RN to alert LC when mom wakes up.  Patient Name: Boy Meeka Cartelli [id number] Date: 05/13/2020     Maternal Data    Feeding    LATCH Score                   Interventions    Lactation Tools Discussed/Used     Consult Status      Charyl Dancer 05/13/2020, 3:22 AM

## 2020-05-13 NOTE — Lactation Note (Signed)
This note was copied from a baby's chart. Lactation Consultation Note  Patient Name: Carolyn Wallace HENID'P Date: 05/13/2020 Reason for consult: Follow-up assessment  P2 mother whose infant is now 21 hours old.  This is a LPTI at 35+5 weeks with a CGA of 36+0 weeks.  Mother breast fed her first child ([redacted] weeks gestation) for one year.  Mother had one prenatal visit and had declined lactation assistance.  Multiple people have discussed the LPTI policy with her and have suggested she supplement after every feeding.  Mother has been politely refusing these suggestions.  Occasionally she will supplement, however, the volume has not been a suggested volume for the baby's current age and number of hours post delivery.    Stressed the importance of feeding at least every three hours or sooner if baby desires once she goes home.  Discussed the age and size of her son in relation to potential for losing weight.  Currently the baby is only down 5% from birth weight.  Mother stated she can hear him swallowing during feedings.  She is able to hand express.  Praised her for her efforts.  Mother has been pumping, but not consistently.    Due to mother having her own plan in place and many staff members educating mother about the LPTI in regards to feeding, I only reiterated what has already been discussed.  Mother verbalized understanding.  Mother has a manual pump and a DEBP for home use.  She has our OP phone number for questions/concerns after discharge.   Maternal Data    Feeding Feeding Type: Formula  LATCH Score                   Interventions    Lactation Tools Discussed/Used     Consult Status Consult Status: Complete Date: 05/13/20 Follow-up type: Call as needed    Emmalyn Hinson R Daniel Johndrow 05/13/2020, 9:24 AM

## 2020-05-13 NOTE — Lactation Note (Signed)
This note was copied from a baby's chart. Lactation Consultation Note LC tried to see mom again. Lights out, mom awake in bed holding baby. LC introduced self. Mom looked very sleepy. FOB sleeping, woke up. LC asked mom how things were going, mom stated good, she just finished feeding baby. Baby is cluster feeding. Asked mom if she is pumping, mom stated yes some, baby feeding a lot. Mom is hand expressing and giving colostrum via spoon. Asked mom if she had colostrum container showing her one, mom said yes. Explained to mom the importance of strict I&O of the baby of how much she is giving the baby of her colostrum. Praised mom for having good colostrum. Mom laid baby in crib. FOB asked if I would ask nurse to let them rest until 0600 and not be disturbed mom was really tired. LC agreed and did so.  Spoke w/RN about how feedings are going, supplementing, and pumping. RN stated they are going to do their own thing the way they want to do it. RN encouraged parents to give more supplement and pumping more.  Parents wants to go home today. RN stated she will update I&O when she goes in at 0600.   Patient Name: Carolyn Wallace HUTML'Y Date: 05/13/2020 Reason for consult: Follow-up assessment;Late-preterm 34-36.6wks;Infant < 6lbs;Primapara   Maternal Data    Feeding    LATCH Score                   Interventions    Lactation Tools Discussed/Used     Consult Status Consult Status: Follow-up Date: 05/13/20 Follow-up type: In-patient    Charyl Dancer 05/13/2020, 4:50 AM

## 2020-05-14 ENCOUNTER — Encounter (HOSPITAL_COMMUNITY): Payer: Self-pay | Admitting: Obstetrics & Gynecology

## 2020-05-15 ENCOUNTER — Ambulatory Visit: Payer: BC Managed Care – PPO

## 2020-06-13 ENCOUNTER — Other Ambulatory Visit: Payer: Self-pay

## 2020-06-13 ENCOUNTER — Ambulatory Visit (INDEPENDENT_AMBULATORY_CARE_PROVIDER_SITE_OTHER): Payer: BC Managed Care – PPO | Admitting: Advanced Practice Midwife

## 2020-06-13 ENCOUNTER — Encounter: Payer: Self-pay | Admitting: Advanced Practice Midwife

## 2020-06-13 NOTE — Patient Instructions (Signed)
Preventive Care 21-28 Years Old, Female Preventive care refers to visits with your health care provider and lifestyle choices that can promote health and wellness. This includes:  A yearly physical exam. This may also be called an annual well check.  Regular dental visits and eye exams.  Immunizations.  Screening for certain conditions.  Healthy lifestyle choices, such as eating a healthy diet, getting regular exercise, not using drugs or products that contain nicotine and tobacco, and limiting alcohol use. What can I expect for my preventive care visit? Physical exam Your health care provider will check your:  Height and weight. This may be used to calculate body mass index (BMI), which tells if you are at a healthy weight.  Heart rate and blood pressure.  Skin for abnormal spots. Counseling Your health care provider may ask you questions about your:  Alcohol, tobacco, and drug use.  Emotional well-being.  Home and relationship well-being.  Sexual activity.  Eating habits.  Work and work environment.  Method of birth control.  Menstrual cycle.  Pregnancy history. What immunizations do I need?  Influenza (flu) vaccine  This is recommended every year. Tetanus, diphtheria, and pertussis (Tdap) vaccine  You may need a Td booster every 10 years. Varicella (chickenpox) vaccine  You may need this if you have not been vaccinated. Human papillomavirus (HPV) vaccine  If recommended by your health care provider, you may need three doses over 6 months. Measles, mumps, and rubella (MMR) vaccine  You may need at least one dose of MMR. You may also need a second dose. Meningococcal conjugate (MenACWY) vaccine  One dose is recommended if you are age 19-21 years and a first-year college student living in a residence hall, or if you have one of several medical conditions. You may also need additional booster doses. Pneumococcal conjugate (PCV13) vaccine  You may need  this if you have certain conditions and were not previously vaccinated. Pneumococcal polysaccharide (PPSV23) vaccine  You may need one or two doses if you smoke cigarettes or if you have certain conditions. Hepatitis A vaccine  You may need this if you have certain conditions or if you travel or work in places where you may be exposed to hepatitis A. Hepatitis B vaccine  You may need this if you have certain conditions or if you travel or work in places where you may be exposed to hepatitis B. Haemophilus influenzae type b (Hib) vaccine  You may need this if you have certain conditions. You may receive vaccines as individual doses or as more than one vaccine together in one shot (combination vaccines). Talk with your health care provider about the risks and benefits of combination vaccines. What tests do I need?  Blood tests  Lipid and cholesterol levels. These may be checked every 5 years starting at age 20.  Hepatitis C test.  Hepatitis B test. Screening  Diabetes screening. This is done by checking your blood sugar (glucose) after you have not eaten for a while (fasting).  Sexually transmitted disease (STD) testing.  BRCA-related cancer screening. This may be done if you have a family history of breast, ovarian, tubal, or peritoneal cancers.  Pelvic exam and Pap test. This may be done every 3 years starting at age 21. Starting at age 30, this may be done every 5 years if you have a Pap test in combination with an HPV test. Talk with your health care provider about your test results, treatment options, and if necessary, the need for more tests.   Follow these instructions at home: Eating and drinking   Eat a diet that includes fresh fruits and vegetables, whole grains, lean protein, and low-fat dairy.  Take vitamin and mineral supplements as recommended by your health care provider.  Do not drink alcohol if: ? Your health care provider tells you not to drink. ? You are  pregnant, may be pregnant, or are planning to become pregnant.  If you drink alcohol: ? Limit how much you have to 0-1 drink a day. ? Be aware of how much alcohol is in your drink. In the [place] one drink equals one 12 oz bottle of beer (355 mL), one 5 oz glass of wine (148 mL), or one 1 oz glass of hard liquor (44 mL). Lifestyle  Take daily care of your teeth and gums.  Stay active. Exercise for at least 30 minutes on 5 or more days each week.  Do not use any products that contain nicotine or tobacco, such as cigarettes, e-cigarettes, and chewing tobacco. If you need help quitting, ask your health care provider.  If you are sexually active, practice safe sex. Use a condom or other form of birth control (contraception) in order to prevent pregnancy and STIs (sexually transmitted infections). If you plan to become pregnant, see your health care provider for a preconception visit. What's next?  Visit your health care provider once a year for a well check visit.  Ask your health care provider how often you should have your eyes and teeth checked.  Stay up to date on all vaccines. This information is not intended to replace advice given to you by your health care provider. Make sure you discuss any questions you have with your health care provider. Document Revised: 07/28/2018 Document Reviewed: 07/28/2018 Elsevier Patient Education  2020 [employer].

## 2020-06-13 NOTE — Progress Notes (Signed)
    Post Partum Visit Note  Carolyn Wallace is a 29 y.o. [phone] female who presents for a postpartum visit. She is 5 weeks postpartum following a normal spontaneous vaginal delivery.  I have fully reviewed the prenatal and intrapartum course. The delivery was at 35.5 gestational weeks.  Anesthesia: none. Postpartum course has been uncomplicated. Baby is doing well. Baby is feeding by expressed breastmilk. Bleeding no bleeding. Bowel function is normal. Bladder function is normal. Patient is sexually active. Contraception method is none. Postpartum depression screening: negative.  The following portions of the patient's history were reviewed and updated as appropriate: allergies, current medications, past family history, past medical history, past social history, past surgical history and problem list.  Review of Systems A comprehensive review of systems was negative.    Objective:  Blood pressure 110/71, pulse 80, temperature 97.9 F (36.6 C), temperature source Oral, height 5\' 4"  (1.626 m), weight 118 lb 9.6 oz (53.8 kg), last menstrual period 09/04/2019, currently breastfeeding.        Assessment:    Normal postpartum exam. Pap smear not done at today's visit.   Plan:   Essential components of care per ACOG recommendations:  1.  Mood and well being: Patient with negative depression screening today. Reviewed local resources for support.   2. Infant care and feeding:  -Patient currently breastmilk feeding? Yes   3. Sexuality, contraception and birth spacing - Declines contraception  4. Sleep and fatigue -Encouraged family/partner/community support of 4 hrs of uninterrupted sleep to help with mood and fatigue  5. Physical Recovery  - Discussed patients delivery and complications - Intact perineum - Patient has urinary incontinence? No - Patient is safe to resume physical and sexual activity  6.  Health Maintenance - Last pap smear done 01/2020 and was normal with negative  HPV.  7. Chronic Disease - No PCP, given ambulatory referral to [facility]  [facility], MSN, CNM Certified Nurse Midwife, [facility] for [facility] 06/13/20 7:51 PM

## 2020-11-30 NOTE — L&D Delivery Note (Signed)
OB/GYN Faculty Practice Delivery Note  Carolyn Wallace is a 30 y.o. [id number] s/p SVD at [redacted]w[redacted]d. She was admitted for labor - presented to MAU with anterior lip and urge to push.   ROM: 1h 56m with clear fluid GBS Status: unkown   Maximum Maternal Temperature:    Labor Progress: Initial SVE: AL/100/0. She then progressed to complete.   Delivery Date/Time: 07/14/21 at 1600 Delivery: Called to room and patient was complete and pushing. Head delivered ROP (and was likely asynclitic). No nuchal cord. Shoulder and body delivered in usual fashion. Infant with spontaneous cry, placed on mother's abdomen, dried and stimulated. Cord clamped x 2 after 1-minute delay, and cut by father. Cord blood drawn. Placenta delivered spontaneously with gentle cord traction. Fundus firm with massage and Pitocin. Labia, perineum, vagina, and cervix inspected inspected with first degree. Repair done in the usual fashion after numbing with 10 cc of 1% lidocaine. Baby Weight: pending  Placenta: Sent to pathology Complications: None Lacerations: 1st degree EBL: 300 mL Analgesia: Local with 1% lidocaine

## 2021-02-03 ENCOUNTER — Ambulatory Visit: Payer: BC Managed Care – PPO

## 2021-02-18 ENCOUNTER — Ambulatory Visit: Payer: Self-pay

## 2021-02-18 ENCOUNTER — Other Ambulatory Visit: Payer: Self-pay

## 2021-03-18 ENCOUNTER — Ambulatory Visit: Payer: Self-pay

## 2021-03-25 ENCOUNTER — Other Ambulatory Visit: Payer: Self-pay

## 2021-03-25 ENCOUNTER — Ambulatory Visit (INDEPENDENT_AMBULATORY_CARE_PROVIDER_SITE_OTHER): Payer: Self-pay | Admitting: *Deleted

## 2021-03-25 VITALS — BP 97/62 | HR 84 | Temp 98.2°F | Ht 64.0 in | Wt 117.6 lb

## 2021-03-25 DIAGNOSIS — Z87898 Personal history of other specified conditions: Secondary | ICD-10-CM | POA: Insufficient documentation

## 2021-03-25 DIAGNOSIS — D582 Other hemoglobinopathies: Secondary | ICD-10-CM | POA: Insufficient documentation

## 2021-03-25 DIAGNOSIS — O09899 Supervision of other high risk pregnancies, unspecified trimester: Secondary | ICD-10-CM | POA: Insufficient documentation

## 2021-03-25 DIAGNOSIS — O0930 Supervision of pregnancy with insufficient antenatal care, unspecified trimester: Secondary | ICD-10-CM

## 2021-03-25 DIAGNOSIS — O0990 Supervision of high risk pregnancy, unspecified, unspecified trimester: Secondary | ICD-10-CM

## 2021-03-25 NOTE — Progress Notes (Signed)
   Location: [facility] Patient: clinic Provider: clinic  PRENATAL INTAKE SUMMARY  Ms. Hedman presents today New OB Nurse Interview.  OB History    Gravida  3   Para  2   Term  0   Preterm  2   AB  0   Living  2     SAB  0   IAB  0   Ectopic  0   Multiple  0   Live Births  2          I have reviewed the patient's medical, obstetrical, social, and family histories, medications, and available lab results.  SUBJECTIVE She has no unusual complaints +UPT at home  OBJECTIVE Initial Nurse interview for history/labs (New OB)  LATE TO CARE  EDD: 07/29/21 by LMP GA: [redacted]w[redacted]d G3P0202 FHT: 150  GENERAL APPEARANCE: alert, well appearing, in no apparent distress, oriented to person, place and time   ASSESSMENT High Risk Pregnancy due history of preterm delivery, IUGR, PROM, low birth weight  PLAN Prenatal care:  To be Determine OB Pnl/HIV/Hep C OB Urine Culture GC/CT HgbEval/SMA/CF ([facility])-Declined Panorama-Declined A1C   Follow Up Instructions:   I discussed the assessment and treatment plan with the patient. The patient was provided an opportunity to ask questions and all were answered. The patient agreed with the plan and demonstrated an understanding of the instructions.   The patient was advised to call back or seek an in-person evaluation if the symptoms worsen or if the condition fails to improve as anticipated.  I provided 40 minutes of  face-to-face time during this encounter.  Clovis Pu, RN

## 2021-03-26 LAB — CBC/D/PLT+RPR+RH+ABO+RUB AB...
Antibody Screen: NEGATIVE
Basophils Absolute: 0 10*3/uL (ref 0.0–0.2)
Basos: 0 %
EOS (ABSOLUTE): 0.1 10*3/uL (ref 0.0–0.4)
Eos: 1 %
HCV Ab: <0.1 s/co ratio (ref 0.0–0.9)
HIV Screen 4th Generation wRfx: NONREACTIVE
Hematocrit: 36.3 % (ref 34.0–46.6)
Hemoglobin: 11.9 g/dL (ref 11.1–15.9)
Hepatitis B Surface Ag: NEGATIVE
Immature Grans (Abs): 0 10*3/uL (ref 0.0–0.1)
Immature Granulocytes: 0 %
Lymphocytes Absolute: 2.5 10*3/uL (ref 0.7–3.1)
Lymphs: 36 %
MCH: 26.3 pg — ABNORMAL LOW (ref 26.6–33.0)
MCHC: 32.8 g/dL (ref 31.5–35.7)
MCV: 80 fL (ref 79–97)
Monocytes Absolute: 0.3 10*3/uL (ref 0.1–0.9)
Monocytes: 5 %
Neutrophils Absolute: 4.1 10*3/uL (ref 1.4–7.0)
Neutrophils: 58 %
Platelets: 154 10*3/uL (ref 150–450)
RBC: 4.53 x10E6/uL (ref 3.77–5.28)
RDW: 13.4 % (ref 11.7–15.4)
RPR Ser Ql: NONREACTIVE
Rh Factor: NEGATIVE
Rubella Antibodies, IGG: 2.59 index (ref >0.99)
WBC: 7.1 10*3/uL (ref 3.4–10.8)

## 2021-03-26 LAB — HEMOGLOBIN A1C
Est. average glucose Bld gHb Est-mCnc: 91 mg/dL
Hgb A1c MFr Bld: 4.8 % (ref 4.8–5.6)

## 2021-03-26 LAB — HCV INTERPRETATION

## 2021-03-27 ENCOUNTER — Other Ambulatory Visit (HOSPITAL_COMMUNITY)
Admission: RE | Admit: 2021-03-27 | Discharge: 2021-03-27 | Disposition: A | Discharge status: Home / Self Care | Payer: 59 | Source: Ambulatory Visit | Attending: Obstetrics and Gynecology | Admitting: Obstetrics and Gynecology

## 2021-03-27 ENCOUNTER — Encounter: Payer: Self-pay | Admitting: Obstetrics and Gynecology

## 2021-03-27 ENCOUNTER — Other Ambulatory Visit: Payer: Self-pay

## 2021-03-27 ENCOUNTER — Ambulatory Visit (INDEPENDENT_AMBULATORY_CARE_PROVIDER_SITE_OTHER): Payer: 59 | Admitting: Obstetrics and Gynecology

## 2021-03-27 VITALS — BP 106/70 | HR 88 | Temp 98.2°F | Wt 116.6 lb

## 2021-03-27 DIAGNOSIS — O0992 Supervision of high risk pregnancy, unspecified, second trimester: Secondary | ICD-10-CM | POA: Diagnosis not present

## 2021-03-27 DIAGNOSIS — Z3A22 22 weeks gestation of pregnancy: Secondary | ICD-10-CM | POA: Diagnosis not present

## 2021-03-27 DIAGNOSIS — O0990 Supervision of high risk pregnancy, unspecified, unspecified trimester: Secondary | ICD-10-CM

## 2021-03-27 LAB — URINE CULTURE, OB REFLEX

## 2021-03-27 LAB — CULTURE, OB URINE

## 2021-03-27 NOTE — Progress Notes (Signed)
INITIAL OBSTETRICAL VISIT Patient name: Carolyn Wallace MRN [id number]  Date of birth: 1991-04-18 Chief Complaint:   Initial Prenatal Visit  History of Present Illness:   Carolyn Wallace is a 30 y.o. G3P0202 African American female at [redacted]w[redacted]d by LMP with an Estimated Date of Delivery: 07/29/21 being seen today for her initial obstetrical visit.  Her obstetrical history is significant for previous pregnancies with IUGR, LBW, PTD (@36 .5 wks and 35 wks). Pregnancy #1: at 26 wks female weighing 4 lbs 15 oz; pregnancy #2: at 62 wks female weighing 5 lbs 9 oz. This is a planned pregnancy. She and the father of the baby (FOB) "Jalen" live together. She has a support system that consists of husband/family/friends. Today she reports no complaints.   Patient's last menstrual period was 10/22/2020 (exact date). Last pap 01/03/2020. Results were: normal Review of Systems:   Pertinent items are noted in HPI Denies cramping/contractions, leakage of fluid, vaginal bleeding, abnormal vaginal discharge w/ itching/odor/irritation, headaches, visual changes, shortness of breath, chest pain, abdominal pain, severe nausea/vomiting, or problems with urination or bowel movements unless otherwise stated above.  Pertinent History Reviewed:  Reviewed past medical,surgical, social, obstetrical and family history.  Reviewed problem list, medications and allergies. OB History  Gravida Para Term Preterm AB Living  3 2 0 2 0 2  SAB IAB Ectopic Multiple Live Births  0 0 0 0 2    # Outcome Date GA Lbr Len/2nd Weight Sex Delivery Anes PTL Lv  3 Current           2 Preterm 05/11/20 [redacted]w[redacted]d  5 lb 9.6 oz (2.54 kg) M Vag-Vacuum None  LIV  1 Preterm 11/20/18 [redacted]w[redacted]d 02:11 / 00:11 4 lb 15.6 oz (2.257 kg) F Vag-Spont None  LIV     Birth Comments: WNL   Physical Assessment:   Vitals:   03/27/21 0838  BP: 106/70  Pulse: 88  Temp: 98.2 F (36.8 C)  Weight: 116 lb 9.6 oz (52.9 kg)  Body mass index is 20.01 kg/m.       Physical  Examination:  General appearance - well appearing, and in no distress  Mental status - alert, oriented to person, place, and time  Psych:  She has a normal mood and affect  Skin - warm and dry, normal color, no suspicious lesions noted  Chest - effort normal, all lung fields clear to auscultation bilaterally  Heart - normal rate and regular rhythm  Abdomen - soft, nontender  Extremities:  No swelling or varicosities noted  Pelvic - deferred  FHTs by doppler: 152 bpm  Assessment & Plan:  1) High-Risk Pregnancy 03/29/21 at [redacted]w[redacted]d with an Estimated Date of Delivery: 07/29/21   2) Initial OB visit - Welcomed to practice and introduced self to patient in addition to discussing other advanced practice providers that she may be seeing at this practice - Congratulated patient - Anticipatory guidance on upcoming appointments - Educated on COVID19 and pregnancy and the integration of virtual appointments  - Educated on babyscripts app- patient reports she has not received email, encouraged to look in spam folder and to call office if she still has not received email - patient verbalizes understanding    3) Supervision of high risk pregnancy, antepartum - Urine cytology ancillary only([place]) - 07/31/21 MFM OB DETAIL +14 WK; Future  4) [redacted] weeks gestation of pregnancy    Meds: No orders of the defined types were placed in this encounter.   Initial labs obtained Continue prenatal vitamins  Reviewed n/v relief measures and warning s/s to report Reviewed recommended weight gain based on pre-gravid BMI Encouraged well-balanced diet Genetic Screening discussed: results reviewed Cystic fibrosis, SMA, Fragile X screening discussed results reviewed The nature of [facility] Faculty Practice with multiple MDs and other Advanced Practice Providers was explained to patient; also emphasized that residents, students are part of our team.  Discussed optimized OB schedule and video visits.  Advised can have an in-office visit whenever she feels she needs to be seen.  Does have own BP cuff. Check BP weekly, let us know if >140/90. Advised to call during normal business hours and there is an after-hours nurse line available.    Follow-up: Return in about 2 weeks (around 04/10/2021) for Return OB visit.   Orders Placed This Encounter  Procedures  . [place] MFM OB DETAIL +14 WK    Raelyn Mora MSN, [place] 03/27/2021

## 2021-03-28 LAB — URINE CYTOLOGY ANCILLARY ONLY
Chlamydia: NEGATIVE
Comment: NEGATIVE
Comment: NEGATIVE
Comment: NORMAL
Neisseria Gonorrhea: NEGATIVE
Trichomonas: NEGATIVE

## 2021-04-10 ENCOUNTER — Encounter: Payer: Self-pay | Admitting: Obstetrics and Gynecology

## 2021-04-10 ENCOUNTER — Other Ambulatory Visit: Payer: Self-pay

## 2021-04-10 ENCOUNTER — Ambulatory Visit (INDEPENDENT_AMBULATORY_CARE_PROVIDER_SITE_OTHER): Payer: 59 | Admitting: Obstetrics and Gynecology

## 2021-04-10 VITALS — BP 101/62 | HR 100 | Wt 119.0 lb

## 2021-04-10 DIAGNOSIS — Z3A24 24 weeks gestation of pregnancy: Secondary | ICD-10-CM | POA: Diagnosis not present

## 2021-04-10 DIAGNOSIS — O09892 Supervision of other high risk pregnancies, second trimester: Secondary | ICD-10-CM

## 2021-04-10 DIAGNOSIS — O09899 Supervision of other high risk pregnancies, unspecified trimester: Secondary | ICD-10-CM

## 2021-04-10 DIAGNOSIS — O0990 Supervision of high risk pregnancy, unspecified, unspecified trimester: Secondary | ICD-10-CM

## 2021-04-10 NOTE — Progress Notes (Signed)
   PRENATAL VISIT NOTE  Subjective:  Carolyn Wallace is a 30 y.o. G9F6213 at [redacted]w[redacted]d being seen today for ongoing prenatal care.  She is currently monitored for the following issues for this high-risk pregnancy and has Supervision of high risk pregnancy, antepartum; Late prenatal care affecting pregnancy, antepartum; History of low birth weight; History of preterm delivery, currently pregnant; and Hemoglobin C trait (HCC) on their problem list.  Patient reports no complaints.  Contractions: Not present. Vag. Bleeding: None.  Movement: Present. Denies leaking of fluid.   The following portions of the patient's history were reviewed and updated as appropriate: allergies, current medications, past family history, past medical history, past social history, past surgical history and problem list.   Objective:   Vitals:   04/10/21 1057  BP: 101/62  Pulse: 100  Weight: 119 lb (54 kg)    Fetal Status: Fetal Heart Rate (bpm): 150 Fundal Height: 24 cm Movement: Present     General:  Alert, oriented and cooperative. Patient is in no acute distress.  Skin: Skin is warm and dry. No rash noted.   Cardiovascular: Normal heart rate noted  Respiratory: Normal respiratory effort, no problems with respiration noted  Abdomen: Soft, gravid, appropriate for gestational age.  Pain/Pressure: Present     Pelvic: Cervical exam deferred        Extremities: Normal range of motion.  Edema: None  Mental Status: Normal mood and affect. Normal behavior. Normal judgment and thought content.   Assessment and Plan:  Pregnancy: [id number] at [redacted]w[redacted]d 1. Supervision of high risk pregnancy, antepartum Patient is doing well without complaints Anatomy scheduled on 5/16 Third trimester labs and glucola next visit  2. History of preterm delivery, currently pregnant Patient declined 17-P  Preterm labor symptoms and general obstetric precautions including but not limited to vaginal bleeding, contractions, leaking of fluid and  fetal movement were reviewed in detail with the patient. Please refer to After Visit Summary for other counseling recommendations.   Return in about 4 weeks (around 05/08/2021) for in person, ROB, High risk, 2 hr glucola next visit.  Future Appointments  Date Time Provider Department Center  04/14/2021 12:45 PM [facility] NURSE [facility]  04/14/2021  1:00 PM [facility] US1 [facility]    Catalina Antigua, MD

## 2021-04-10 NOTE — Progress Notes (Signed)
ROB [redacted]w[redacted]d  CC: None    

## 2021-04-14 ENCOUNTER — Other Ambulatory Visit: Payer: Self-pay

## 2021-04-14 ENCOUNTER — Ambulatory Visit: Payer: 59

## 2021-04-14 ENCOUNTER — Telehealth: Payer: Self-pay

## 2021-04-14 NOTE — Telephone Encounter (Signed)
Patient was scheduled for a 12:45pm Nurse Visit and a 1pm Detail, Anatomy ultrasound appointment.  Patient arrived after her 1pm Detail, Anatomy ultrasound per Carolyn Wallace I. Patient will have to reschedule.  Patient refused to reschedule and walked out.

## 2021-05-09 ENCOUNTER — Other Ambulatory Visit: Payer: 59

## 2021-05-09 ENCOUNTER — Encounter: Payer: 59 | Admitting: Family Medicine

## 2021-07-14 ENCOUNTER — Other Ambulatory Visit: Payer: Self-pay

## 2021-07-14 ENCOUNTER — Inpatient Hospital Stay (HOSPITAL_COMMUNITY)
Admission: AD | Admit: 2021-07-14 | Discharge: 2021-07-15 | DRG: 807 | Disposition: A | Discharge status: Home / Self Care | Payer: 59 | Attending: Obstetrics and Gynecology | Admitting: Obstetrics and Gynecology

## 2021-07-14 ENCOUNTER — Encounter (HOSPITAL_COMMUNITY): Payer: Self-pay | Admitting: Obstetrics and Gynecology

## 2021-07-14 DIAGNOSIS — Z6791 Unspecified blood type, Rh negative: Secondary | ICD-10-CM | POA: Diagnosis not present

## 2021-07-14 DIAGNOSIS — O0990 Supervision of high risk pregnancy, unspecified, unspecified trimester: Secondary | ICD-10-CM

## 2021-07-14 DIAGNOSIS — O26893 Other specified pregnancy related conditions, third trimester: Secondary | ICD-10-CM | POA: Diagnosis present

## 2021-07-14 DIAGNOSIS — D696 Thrombocytopenia, unspecified: Secondary | ICD-10-CM

## 2021-07-14 DIAGNOSIS — O99119 Other diseases of the blood and blood-forming organs and certain disorders involving the immune mechanism complicating pregnancy, unspecified trimester: Secondary | ICD-10-CM

## 2021-07-14 DIAGNOSIS — Z87898 Personal history of other specified conditions: Secondary | ICD-10-CM

## 2021-07-14 DIAGNOSIS — O09899 Supervision of other high risk pregnancies, unspecified trimester: Secondary | ICD-10-CM

## 2021-07-14 DIAGNOSIS — D582 Other hemoglobinopathies: Secondary | ICD-10-CM | POA: Diagnosis present

## 2021-07-14 DIAGNOSIS — O0930 Supervision of pregnancy with insufficient antenatal care, unspecified trimester: Secondary | ICD-10-CM

## 2021-07-14 DIAGNOSIS — Z20822 Contact with and (suspected) exposure to covid-19: Secondary | ICD-10-CM | POA: Diagnosis present

## 2021-07-14 DIAGNOSIS — O9912 Other diseases of the blood and blood-forming organs and certain disorders involving the immune mechanism complicating childbirth: Secondary | ICD-10-CM | POA: Diagnosis present

## 2021-07-14 DIAGNOSIS — Z3A37 37 weeks gestation of pregnancy: Secondary | ICD-10-CM

## 2021-07-14 DIAGNOSIS — D6959 Other secondary thrombocytopenia: Secondary | ICD-10-CM | POA: Diagnosis present

## 2021-07-14 DIAGNOSIS — O26899 Other specified pregnancy related conditions, unspecified trimester: Secondary | ICD-10-CM

## 2021-07-14 LAB — CBC
HCT: 38.7 % (ref 36.0–46.0)
Hemoglobin: 13.4 g/dL (ref 12.0–15.0)
MCH: 25.9 pg — ABNORMAL LOW (ref 26.0–34.0)
MCHC: 34.6 g/dL (ref 30.0–36.0)
MCV: 74.7 fL — ABNORMAL LOW (ref 80.0–100.0)
Platelets: 122 10*3/uL — ABNORMAL LOW (ref 150–400)
RBC: 5.18 MIL/uL — ABNORMAL HIGH (ref 3.87–5.11)
RDW: 14.2 % (ref 11.5–15.5)
WBC: 13.2 10*3/uL — ABNORMAL HIGH (ref 4.0–10.5)
nRBC: 0 % (ref 0.0–0.2)

## 2021-07-14 LAB — RESP PANEL BY RT-PCR (FLU A&B, COVID) ARPGX2
Influenza A by PCR: NEGATIVE
Influenza B by PCR: NEGATIVE
SARS Coronavirus 2 by RT PCR: NEGATIVE

## 2021-07-14 LAB — TYPE AND SCREEN
ABO/RH(D): O NEG
Antibody Screen: NEGATIVE

## 2021-07-14 MED ORDER — DIBUCAINE (PERIANAL) 1 % EX OINT: 1.0000 "application " | TOPICAL_OINTMENT | CUTANEOUS | Status: DC | PRN

## 2021-07-14 MED ORDER — COCONUT OIL OIL: 1.0000 "application " | TOPICAL_OIL | Status: DC | PRN

## 2021-07-14 MED ORDER — PRENATAL MULTIVITAMIN CH: 1.0000 | ORAL_TABLET | Freq: Every day | ORAL | Status: DC

## 2021-07-14 MED ORDER — ONDANSETRON HCL 4 MG/2ML IJ SOLN: 4.0000 mg | INTRAMUSCULAR | Status: DC | PRN

## 2021-07-14 MED ORDER — OXYTOCIN-SODIUM CHLORIDE 30-0.9 UT/500ML-% IV SOLN: 2.5000 [IU]/h | INTRAVENOUS | Status: DC

## 2021-07-14 MED ORDER — FLEET ENEMA 7-19 GM/118ML RE ENEM: 1.0000 | ENEMA | RECTAL | Status: DC | PRN

## 2021-07-14 MED ORDER — ONE A DAY PRENATAL 0.4-25 MG PO CHEW: 2.0000 | CHEWABLE_TABLET | Freq: Every day | ORAL | Status: DC

## 2021-07-14 MED ORDER — ACETAMINOPHEN 325 MG PO TABS: 650.0000 mg | ORAL_TABLET | ORAL | Status: DC | PRN

## 2021-07-14 MED ORDER — TETANUS-DIPHTH-ACELL PERTUSSIS 5-2.5-18.5 LF-MCG/0.5 IM SUSY: 0.5000 mL | PREFILLED_SYRINGE | Freq: Once | INTRAMUSCULAR | Status: DC

## 2021-07-14 MED ORDER — LACTATED RINGERS IV SOLN: INTRAVENOUS | Status: DC

## 2021-07-14 MED ORDER — ACETAMINOPHEN 325 MG PO TABS
650.0000 mg | ORAL_TABLET | Freq: Four times a day (QID) | ORAL | Status: DC
  Filled 2021-07-14: qty 2

## 2021-07-14 MED ORDER — LIDOCAINE HCL (PF) 1 % IJ SOLN
INTRAMUSCULAR | Status: AC
  Filled 2021-07-14: qty 30

## 2021-07-14 MED ORDER — BENZOCAINE-MENTHOL 20-0.5 % EX AERO
1.0000 "application " | INHALATION_SPRAY | CUTANEOUS | Status: DC | PRN
  Filled 2021-07-14: qty 56

## 2021-07-14 MED ORDER — ONDANSETRON HCL 4 MG/2ML IJ SOLN: 4.0000 mg | Freq: Four times a day (QID) | INTRAMUSCULAR | Status: DC | PRN

## 2021-07-14 MED ORDER — OXYCODONE-ACETAMINOPHEN 5-325 MG PO TABS: 1.0000 | ORAL_TABLET | ORAL | Status: DC | PRN

## 2021-07-14 MED ORDER — LACTATED RINGERS IV SOLN: 500.0000 mL | INTRAVENOUS | Status: DC | PRN

## 2021-07-14 MED ORDER — SOD CITRATE-CITRIC ACID 500-334 MG/5ML PO SOLN: 30.0000 mL | ORAL | Status: DC | PRN

## 2021-07-14 MED ORDER — IBUPROFEN 600 MG PO TABS: 600.0000 mg | ORAL_TABLET | Freq: Four times a day (QID) | ORAL | Status: DC

## 2021-07-14 MED ORDER — OXYTOCIN BOLUS FROM INFUSION: 333.0000 mL | Freq: Once | INTRAVENOUS | Status: DC

## 2021-07-14 MED ORDER — SENNOSIDES-DOCUSATE SODIUM 8.6-50 MG PO TABS: 2.0000 | ORAL_TABLET | ORAL | Status: DC

## 2021-07-14 MED ORDER — ONDANSETRON HCL 4 MG PO TABS: 4.0000 mg | ORAL_TABLET | ORAL | Status: DC | PRN

## 2021-07-14 MED ORDER — OXYCODONE-ACETAMINOPHEN 5-325 MG PO TABS: 2.0000 | ORAL_TABLET | ORAL | Status: DC | PRN

## 2021-07-14 MED ORDER — LIDOCAINE HCL (PF) 1 % IJ SOLN
30.0000 mL | INTRAMUSCULAR | Status: AC | PRN
  Administered 2021-07-14: 30 mL via SUBCUTANEOUS

## 2021-07-14 MED ORDER — SIMETHICONE 80 MG PO CHEW: 80.0000 mg | CHEWABLE_TABLET | ORAL | Status: DC | PRN

## 2021-07-14 MED ORDER — DIPHENHYDRAMINE HCL 25 MG PO CAPS: 25.0000 mg | ORAL_CAPSULE | Freq: Four times a day (QID) | ORAL | Status: DC | PRN

## 2021-07-14 MED ORDER — WITCH HAZEL-GLYCERIN EX PADS: 1.0000 "application " | MEDICATED_PAD | CUTANEOUS | Status: DC | PRN

## 2021-07-14 NOTE — MAU Note (Signed)
.  Carolyn Wallace is a 30 y.o. at [redacted]w[redacted]d here in MAU reporting: patient bypass triage, stating she feels the need to push. Patient checked by Dr. Adrian Blackwater and 9.5 cm. Grossly ruptured. L&D charge nurse called and report given. MD accompanied transport.

## 2021-07-14 NOTE — H&P (Signed)
OBSTETRIC ADMISSION HISTORY AND PHYSICAL  Carolyn Wallace is a 30 y.o. female G3P0202 with IUP at [redacted]w[redacted]d by LMP presenting for labor. She reports +FMs, No LOF, no VB, no blurry vision, headaches or peripheral edema, and RUQ pain.  She plans on breast feeding. She request nothing for birth control. She [facility] her [facility]   Dating: By LMP --->  Estimated Date of Delivery: 07/29/21  Sono:    She did not have any [place] during this pregnancy and had limited PNC   Prenatal History/Complications:  H/o IUGR Limited PNC H/o PTB Hemoglobin C trait Rh negative  Past Medical History: Past Medical History:  Diagnosis Date   Preterm labor 05/11/2020   Preterm labor in third trimester with preterm delivery 05/11/2020   Supervision of other normal pregnancy, antepartum 12/11/2019    Nursing Staff Provider Office Location  [facility] Dating  LMP Language  English Anatomy US   Flu Vaccine  Declined 12/11/2019 Genetic Screen  NIPS:   AFP:   First Screen:  Quad:   TDaP vaccine    Hgb A1C or  GTT Early  Third trimester  Rhogam  N/A   LAB RESULTS  Feeding Plan Breast Blood Type O/Negative/-- (01/11 1007)  Contraception Undecided Antibody Negative (01/11 1007) Circumcision If boy,     Past Surgical History: Past Surgical History:  Procedure Laterality Date   APPENDECTOMY  2007    Obstetrical History: OB History     Gravida  3   Para  2   Term  0   Preterm  2   AB  0   Living  2      SAB  0   IAB  0   Ectopic  0   Multiple  0   Live Births  2           Social History Social History   Socioeconomic History   Marital status: Married    Spouse name: Not on file   Number of children: 2   Years of education: Not on file   Highest education level: Associate degree: academic program  Occupational History   Not on file  Tobacco Use   Smoking status: Never   Smokeless tobacco: Never  Vaping Use   Vaping Use: Never used  Substance and Sexual Activity    Alcohol use: No   Drug use: No   Sexual activity: Yes    Birth control/protection: None  Other Topics Concern   Not on file  Social History Narrative   Not on file   Social Determinants of Health   Financial Resource Strain: Not on file  Food Insecurity: Not on file  Transportation Needs: Not on file  Physical Activity: Not on file  Stress: Not on file  Social Connections: Not on file    Family History: Family History  Problem Relation Age of Onset   Crohn's disease Paternal Uncle     Allergies: No Known Allergies  Medications Prior to Admission  Medication Sig Dispense Refill Last Dose   Prenatal MV & Min w/FA-DHA (ONE A DAY PRENATAL) 0.4-25 MG CHEW Chew 2 each by mouth daily.        Review of Systems   All systems reviewed and negative except as stated in HPI  Blood pressure (!) 154/70, pulse 82, temperature 97.9 F (36.6 C), temperature source Axillary, resp. rate 16, height 5\' 4"  (1.626 m), weight 53.1 kg, last menstrual period 10/22/2020, not currently breastfeeding. General appearance: alert, cooperative, appears stated  age, and severe distress Lungs: clear to auscultation bilaterally Heart: regular rate and rhythm Abdomen: soft, non-tender; bowel sounds normal Pelvic: AL/100/0 Extremities: Homans sign is negative, no sign of DVT Presentation: cephalic Fetal monitoringBaseline: 110 bpm, category 1 tracing Uterine activityFrequency: Every 3 minutes and Intensity: strong Dilation: 10 Effacement (%): 100 Station: 0 Exam by:: Dr Para March   Prenatal labs: ABO, Rh: --/--/PENDING (08/15 1710) Antibody: PENDING (08/15 1710) Rubella: 2.59 (04/26 1108) RPR: Non Reactive (04/26 1108)  HBsAg: Negative (04/26 1108)  HIV: Non Reactive (04/26 1108)  GBS:   Unknown 1 hr Glucola None - A1C on 4/26 was 4.8 Genetic screening  Declined Anatomy US None  Prenatal Transfer Tool  Maternal Diabetes: No Genetic Screening: Declined Maternal Ultrasounds/Referrals:  Declined Fetal Ultrasounds or other Referrals:  None Maternal Substance Abuse:  No Significant Maternal Medications:  None Significant Maternal Lab Results: None and Other: limtied PNC  Results for orders placed or performed during the hospital encounter of 07/14/21 (from the past 24 hour(s))  Type and screen [facility]   Collection Time: 07/14/21  5:10 PM  Result Value Ref Range   ABO/RH(D) PENDING    Antibody Screen PENDING    Sample Expiration      07/17/2021,2359 Performed at [facility], [address] [place], [place] [address]     Patient Active Problem List   Diagnosis Date Noted   Indication for care in labor or delivery 07/14/2021   Supervision of high risk pregnancy, antepartum 03/25/2021   Late prenatal care affecting pregnancy, antepartum 03/25/2021   History of low birth weight 03/25/2021   History of preterm delivery, currently pregnant 03/25/2021   Hemoglobin C trait (HCC) 03/25/2021    Assessment/Plan:  Carolyn Wallace is a 30 y.o. G3P0202 at [redacted]w[redacted]d here for labor  #Labor:Presented at 9 cm, admitted for labor #Pain: Desires unmedicated delivery #FWB: Category 1 tracing #ID:  GBS unknown, PCN not indicated by history or GA #MOF: Breastfeeding #MOC: Declines #Circ:  Declines - plans to do as an outpatient  Milas Hock, MD  07/14/2021, 5:23 PM

## 2021-07-14 NOTE — Discharge Summary (Signed)
Postpartum Discharge Summary  Date of Service updated    Patient Name: Carolyn Wallace DOB: [birth year] MRN: [id number]  Date of admission: 07/14/2021 Delivery date:07/14/2021  Delivering provider: Radene Gunning  Date of discharge: 07/15/2021  Admitting diagnosis: Indication for care in labor or delivery [O75.9] Intrauterine pregnancy: [redacted]w[redacted]d    Secondary diagnosis:  Active Problems:   Rh negative status during pregnancy   Late prenatal care affecting pregnancy, antepartum   [place]   Indication for care in labor or delivery   Gestational thrombocytopenia ([facility]  Additional problems: None    Discharge diagnosis: Term Pregnancy Delivered                                              Post partum procedures: None  Augmentation: N/A Complications: None  Hospital course: Onset of Labor With Vaginal Delivery      30y.o. yo [id number] 315w6das admitted in Active Labor on 07/14/2021. Patient had an uncomplicated labor course as follows:  Membrane Rupture Time/Date: 2:30 PM ,07/14/2021   Delivery Method:Vaginal, Spontaneous  Episiotomy: None  Lacerations:  1st degree;Perineal  Patient had an uncomplicated postpartum course.  She is ambulating, tolerating a regular diet, passing flatus, and urinating well. Patient is discharged home in stable condition on 07/15/21.  Newborn Data: Birth date:07/14/2021  Birth time:4:00 PM  Gender:Female  Living status:Living  Apgars:8 ,9  Weight:2350 g   Magnesium Sulfate received: No BMZ received: No Rhophylac:No (infant O pos, declined RhoGAM)  See Dr. StGlenna Durandrogress note 07/15/2021 as below:  "Discussed Rh negative state in mom and Rh positive state in baby. We discussed pathogenesis of Rh alloimmunization and the impact of alloimmunization on subsequent pregnancies with Rh positive fetus. We discussed the role of Rhogam injection, what it is, and how it protects against alloimmunization. Additionally, we discussed testing,  monitoring (including MCA dopplers), etc in subsequent pregnancies. All questions answered. Patient and partner demonstrated understanding. They still declined Rhogam injection." MMR:N/A T-DaP: N/A Flu: N/A Transfusion:No  Physical exam  Vitals:   07/14/21 1820 07/14/21 1930 07/14/21 2330 07/15/21 0345  BP: 101/67 122/81 98/60 112/79  Pulse: 91 85 68 69  Resp: _0 Temp: 98.1 F (36.7 C) 98.7 F (37.1 C) 98.6 F (37 C) 98.7 F (37.1 C)  TempSrc: Oral Oral Oral Oral  SpO2:  100% 99% 98%  Weight:      Height:       Seen by Dr. StNehemiah Settle General: alert, cooperative, and no distress Lochia: appropriate Uterine Fundus: firm Incision: N/A DVT Evaluation: No evidence of DVT seen on physical exam. Negative Homan's sign. Labs: Lab Results  Component Value Date   WBC 13.2 (H) 07/14/2021   HGB 13.4 07/14/2021   HCT 38.7 07/14/2021   MCV 74.7 (L) 07/14/2021   PLT 122 (L) 07/14/2021   No flowsheet data found. Edinburgh Score: Edinburgh Postnatal Depression Scale Screening Tool 07/15/2021  I have been able to laugh and see the funny side of things. 0  I have looked forward with enjoyment to things. 0  I have blamed myself unnecessarily when things went wrong. 1  I have been anxious or worried for no good reason. 1  I have felt scared or panicky for no good reason. 1  Things have been getting on top of me. 1  I  have been so unhappy that I have had difficulty sleeping. 0  I have felt sad or miserable. 0  I have been so unhappy that I have been crying. 0  The thought of harming myself has occurred to me. 0  Edinburgh Postnatal Depression Scale Total 4     After visit meds:  Allergies as of 07/15/2021   No Known Allergies      Medication List     TAKE these medications    acetaminophen 325 MG tablet Commonly known as: Tylenol Take 2 tablets (650 mg total) by mouth every 6 (six) hours as needed.   ibuprofen 600 MG tablet Commonly known as: ADVIL Take 1  tablet (600 mg total) by mouth every 6 (six) hours as needed.   One A Day Prenatal 0.4-25 MG Chew Chew 2 each by mouth daily.         Discharge home in stable condition Infant Feeding: Breast Infant Disposition:home with mother Discharge instruction: per After Visit Summary and Postpartum booklet. Activity: Advance as tolerated. Pelvic rest for 6 weeks.  Diet: routine diet Future Appointments: Future Appointments  Date Time Provider [place]  08/18/2021  1:10 PM Leftwich-Kirby, Kathie Dike, CNM [place] None   Follow up Visit:  Follow-up [place] Follow up.   Specialty: Obstetrics and Gynecology Contact information: [address], [place] [phone]               Message sent to schedulers on 07/14/21  Please schedule this patient for a In person postpartum visit in 6 weeks with the following provider: Any provider. Additional Postpartum F/U: None  Low risk pregnancy complicated by: Rh neg status, gestational thrombocytopenia  Delivery mode:  Vaginal, Spontaneous  Anticipated Birth Control:   Declined   07/15/2021 Patriciaann Clan, DO

## 2021-07-14 NOTE — OB Triage Provider Note (Signed)
   S: Ms. Mauricia Mertens is a 30 y.o. [phone] at [redacted]w[redacted]d  who presents to MAU today complaining contractions q 2-3 minutes since 3am. She denies vaginal bleeding. She endorses LOF. She reports normal fetal movement.    O: LMP 10/22/2020 (Exact Date)  GENERAL: Well-developed, well-nourished female in no acute distress.  HEAD: Normocephalic, atraumatic.  CHEST: Normal effort of breathing, regular heart rate ABDOMEN: Soft, nontender, gravid  Cervical exam:  Dilation: Lip/rim Presentation: Vertex Exam by:: Dr. Adrian Blackwater   Fetal Monitoring: Cat 1 tracing  A: SIUP at [redacted]w[redacted]d  Active labor  P: Admit  Levie Heritage, DO 07/14/2021 2:51 PM

## 2021-07-15 DIAGNOSIS — O99119 Other diseases of the blood and blood-forming organs and certain disorders involving the immune mechanism complicating pregnancy, unspecified trimester: Secondary | ICD-10-CM

## 2021-07-15 LAB — RPR: RPR Ser Ql: NONREACTIVE

## 2021-07-15 MED ORDER — IBUPROFEN 600 MG PO TABS: 600.0000 mg | ORAL_TABLET | Freq: Four times a day (QID) | ORAL | 0 refills | Status: AC | PRN

## 2021-07-15 MED ORDER — ACETAMINOPHEN 325 MG PO TABS: 650.0000 mg | ORAL_TABLET | Freq: Four times a day (QID) | ORAL | Status: AC | PRN

## 2021-07-15 MED ORDER — RHO D IMMUNE GLOBULIN 1500 UNIT/2ML IJ SOSY
300.0000 ug | PREFILLED_SYRINGE | Freq: Once | INTRAMUSCULAR | Status: DC
  Filled 2021-07-15: qty 2

## 2021-07-15 NOTE — Progress Notes (Addendum)
Post Partum Day 1 Subjective: Carolyn Wallace reports feeling well this morning with minimal pain. She is ambulating independently. She is urinating with passing flatus but has not yet had a bowel movement. She would like to go home today if possible.    Objective: Blood pressure 112/79, pulse 69, temperature 98.7 F (37.1 C), temperature source Oral, resp. rate 16, height 5\' 4"  (1.626 m), weight 53.1 kg, last menstrual period 10/22/2020, SpO2 98 %, unknown if currently breastfeeding.  Physical Exam:  General: alert and no distress Lochia: appropriate Uterine Fundus: firm Incision: N/A DVT Evaluation: No cords or calf tenderness. No significant calf/ankle edema.  Recent Labs    07/14/21 1746  HGB 13.4  HCT 38.7    Assessment/Plan: PPD #1 - Meeting all PP milestones  - VSS   Limited prenatal care - Patient did not receive prenatal care past [redacted] weeks gestation, she reports she no longer wanted to come to her appointments - Need social work consult prior to discharge   Rh negative in pregnancy  - Baby is O positive - Patient is refusing Rhogam, she was counseled on the risks of not receiving this   Contraception: declines  Feeding: breast    LOS: 1 day   07/16/21 07/15/2021, 7:54 AM

## 2021-07-15 NOTE — Clinical Social Work Maternal (Addendum)
CLINICAL SOCIAL WORK MATERNAL/CHILD NOTE  Patient Details  Name: Carolyn Wallace MRN: [id number] Date of Birth: 09/18/1991  Date:  07/15/2021  Clinical Social Worker Initiating Note:  Kathrin Greathouse, [place] Date/Time: Initiated:  07/15/21/1028     Child's Name:  Carolyn Wallace   Biological Parents:  Mother, Father  (Father: Airline pilot Sr.)  Need for Interpreter:  None   Reason for Referral:  (Limited PNC-2 Visits)   Address:  [address] [place] [address]    Phone number:  There are no phone numbers on file.    Additional phone number:   Household Members/Support Persons (HM/SP):   Household Member/Support Person 1, Household Member/Support Person 2, Household Member/Support Person 3, Household Member/Support Person 4   HM/SP Name Relationship DOB or Age  HM/SP -69 Jalen Revell Sr. Spouse 08-08-1992  HM/SP -2 Ahna Filley Daughter 11-20-18  HM/SP -3 Jalen Hiltunen Brooke Bonito. Son 05-11-2020  HM/SP -4        HM/SP -5        HM/SP -6        HM/SP -7        HM/SP -8          Natural Supports (not living in the home):  Extended Family, Artist Supports: None   Employment: Unemployed   Type of Work:     Education:  Other (comment) (Geophysicist/field seismologist)   Homebound arranged:    Museum/gallery curator Resources:  Multimedia programmer    Other Resources:      Cultural/Religious Considerations Which May Impact Care:    Strengths:  Ability to meet basic needs  , Home prepared for child  , Pediatrician chosen   Psychotropic Medications:         Pediatrician:    [place]  Pediatrician List:   [facility] ([address])  [place]      Pediatrician Fax Number:    Risk Factors/Current Problems:  None   Cognitive State:  Alert  , Able to Concentrate  , Linear Thinking     Mood/Affect:  Calm     CSW Assessment:  CSW  received consult for Limited PNC. CSW met with MOB to offer support and complete assessment.   CSW met with MOB at bedside. CSW observed MOB sitting up in bed holding the infant, and FOB was exiting room at the start of the assessment. CSW congratulated MOB and introduced CSW role. MOB presented calm and receptive to [place] visit. CSW checked MOB demographic information, she informed CSW her phone number has changed to [phone]. CSW inquired how MOB has felt emotionally since giving birth. MOB expressed feeling good. MOB shared she had a good pregnancy with no concerns. CSW inquired about MOB limited P[facility] visits at [facility]. MOB explain she stopped going to the visits because she did not need to go. She stated if there were concerns with the baby, she would have continued the appointments. CSW informed MOB about the hospital drug screen policy due to limited [facility]. MOB made aware that CSW will follow infant's UDS, CDS and make a report to CPS if warranted. MOB reported understanding and had no questions. CSW inquired about history of mental health, therapy and medication treatment. MOB reported no history of mental health concerns, therapy or medication treatment. CSW inquired if MOB experienced postpartum depression/anxiety with her older children.  MOB was very knowledgeable of postpartum and stated she has not experienced postpartum depression. CSW provided education regarding the baby blues period vs. perinatal mood disorders, discussed treatment and gave resources for mental health follow up. CSW recommended MOB complete a self-evaluation during the postpartum time period using the New Mom Checklist from Postpartum Progress and encouraged MOB to contact a medical professional if symptoms are noted at any time. MOB reported understanding. CSW assessed MOB for safety. MOB denied thoughts of harm to self and others. MOB denied domestic violence. CSW inquired about MOB supports. MOB identified her spouse, mom, dad and  mother-in-law as supports.   CSW provided review of Sudden Infant Death Syndrome (SIDS) precautions. MOB reported she has essential items for the infant including a crib and bassinet where the infant will safe sleep. MOB has chosen [facility] for infant's follow up care. CSW assessed MOB for additional needs. MOB reported no further needs.   -CSW will follow infant's UDS, CDS and make a report to CPS if warranted.  CSW identifies no further need for intervention and no barriers to discharge at this time.  CSW Plan/Description:  Perinatal Mood and Anxiety Disorder (PMADs) Education, Sudden Infant Death Syndrome (SIDS) Education, [place], CSW Will Continue to Monitor Umbilical Cord Tissue Drug Screen Results and Make Report if Warranted, No Further Intervention Required/No Barriers to Discharge    Lia Hopping, LCSW 07/15/2021, 11:12 AM

## 2021-07-15 NOTE — Progress Notes (Signed)
Post Partum Day 1 Subjective: no complaints, up ad lib, voiding, and tolerating PO  Objective: Blood pressure 112/79, pulse 69, temperature 98.7 F (37.1 C), temperature source Oral, resp. rate 16, height 5\' 4"  (1.626 m), weight 53.1 kg, last menstrual period 10/22/2020, SpO2 98 %, unknown if currently breastfeeding.  Physical Exam:  General: alert, cooperative, and no distress Lochia: appropriate Uterine Fundus: firm DVT Evaluation: No evidence of DVT seen on physical exam. Negative Homan's sign.  Recent Labs    07/14/21 1746  HGB 13.4  HCT 38.7    Assessment/Plan: Plans for discharge later today vs tomorrow. 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 We discussed pathogenesis of Rh alloimmunization and the impact of alloimmunization on subsequent pregnancies with Rh positive fetus. We discussed the role of Rhogam injection, what it is, and how it protects against alloimmunization. Additionally, we discussed testing, monitoring (including MCA dopplers), etc in subsequent pregnancies. All questions answered. Patient and partner demonstrated understanding. They still declined Rhogam injection. I asked them to please let me know if they change their mind before leaving the hospital.   LOS: 1 day   07/16/21 07/15/2021, 10:38 AM

## 2021-07-16 LAB — RH IG WORKUP (INCLUDES ABO/RH)
Fetal Screen: NEGATIVE
Gestational Age(Wks): 37.6
Unit division: 0

## 2021-07-23 LAB — SURGICAL PATHOLOGY

## 2021-07-24 ENCOUNTER — Telehealth (HOSPITAL_COMMUNITY): Payer: Self-pay | Admitting: *Deleted

## 2021-07-24 NOTE — Telephone Encounter (Signed)
Mom reports feeling good. No concerns about herself. EPDS in hospital = 4. Asked if nurse could call back tomorrow when she would have more time to do EPDS then. Mom reports baby is doing well. Feeding, peeing, and pooping with no difficulty. No concerns about baby.  Duffy Rhody, RN 07-24-2021 at 2:50pm

## 2021-08-18 ENCOUNTER — Ambulatory Visit: Payer: 59 | Admitting: Advanced Practice Midwife

## 2022-01-09 IMAGING — US US MFM OB FOLLOW-UP
1 series · 14 of 28 positions shown · non-contrast
Comparison: none

[Series 1: us mfm ob follow-up · 14 of 44 slices shown]
[im 2/44]
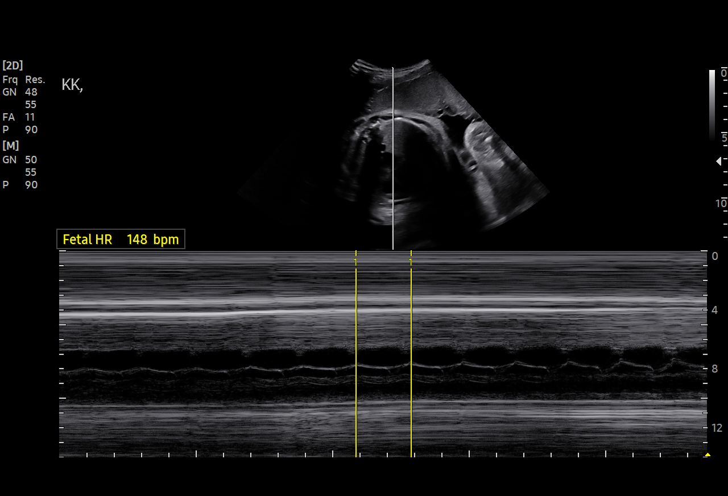
[im 5/44]
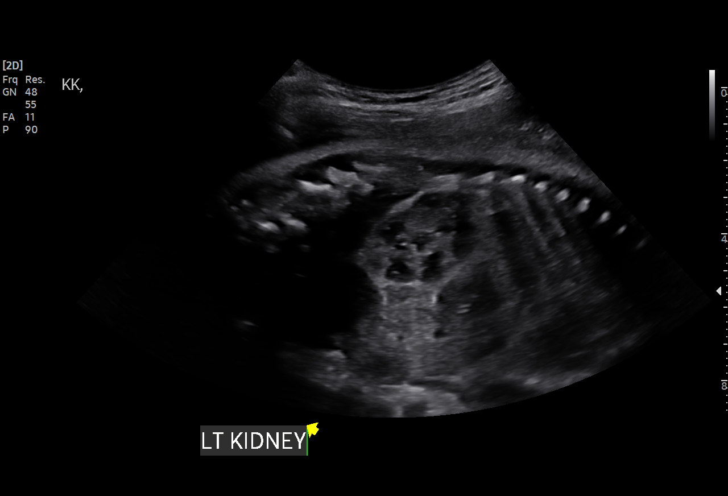
[im 8/44]
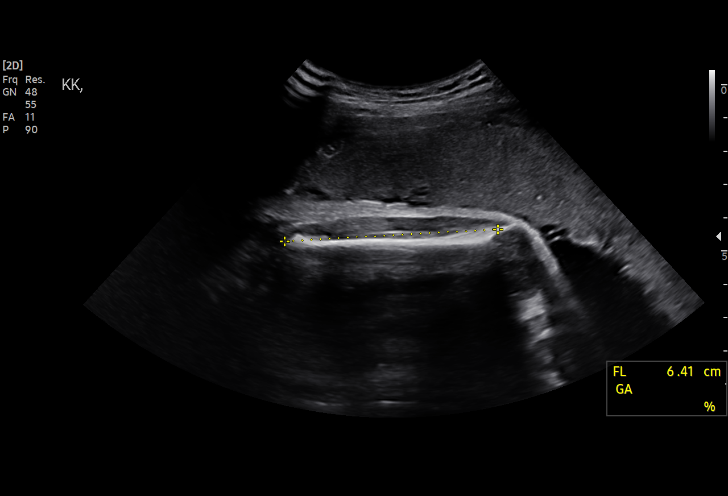
[im 12/44]
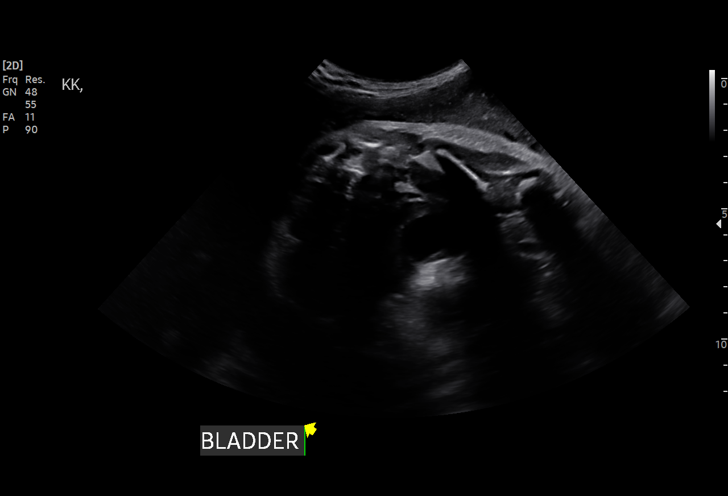
[im 15/44]
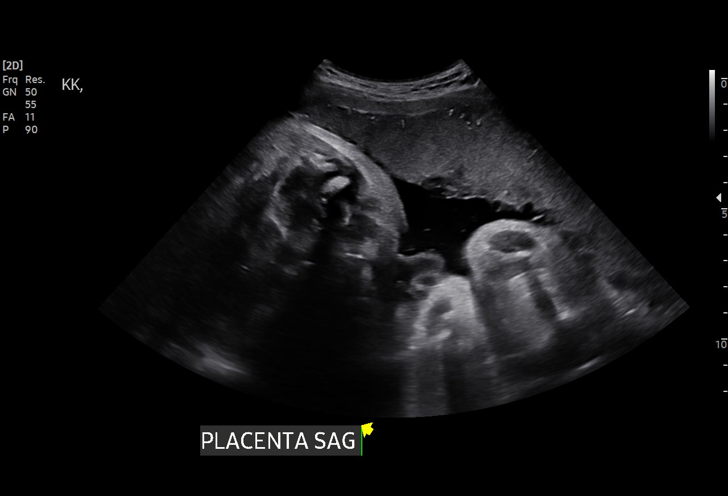
[im 18/44]
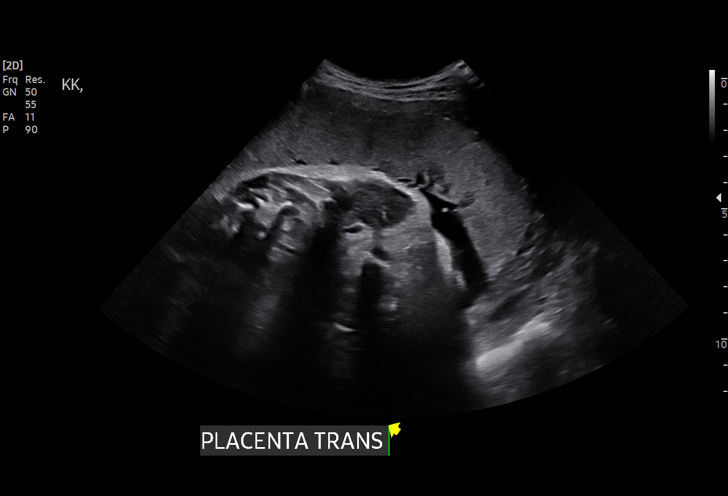
[im 21/44]
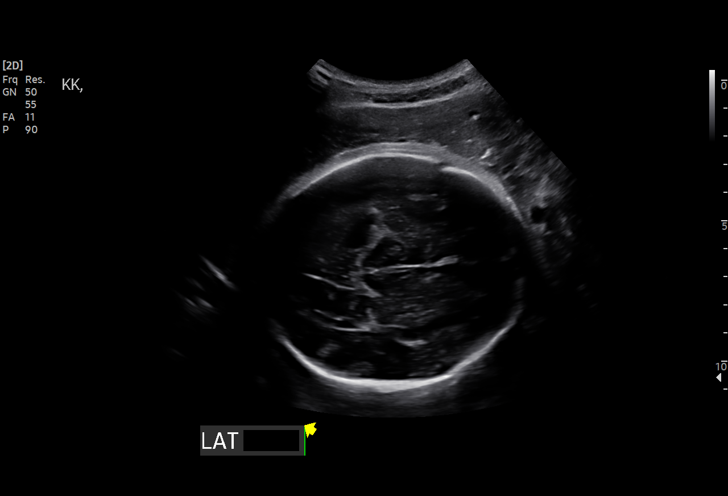
[im 24/44]
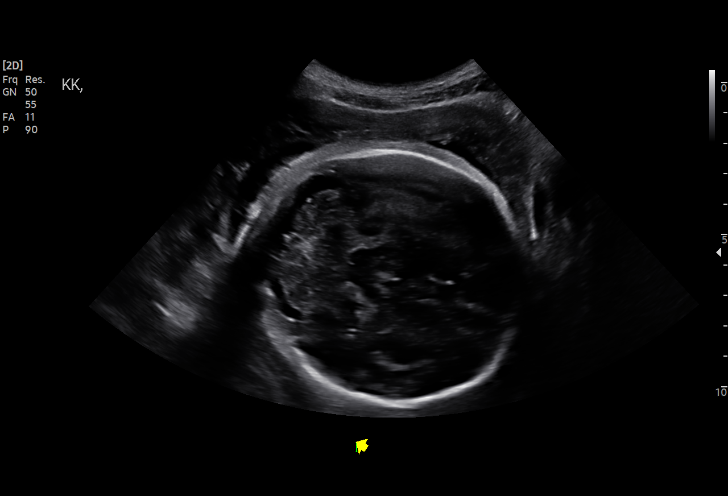
[im 28/44]
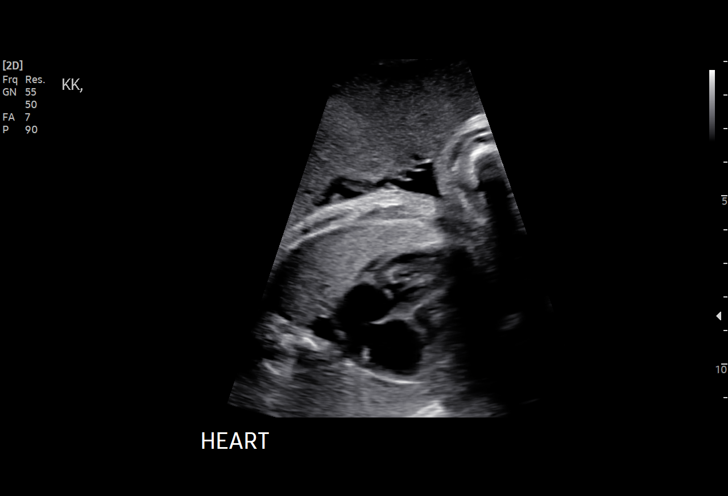
[im 31/44]
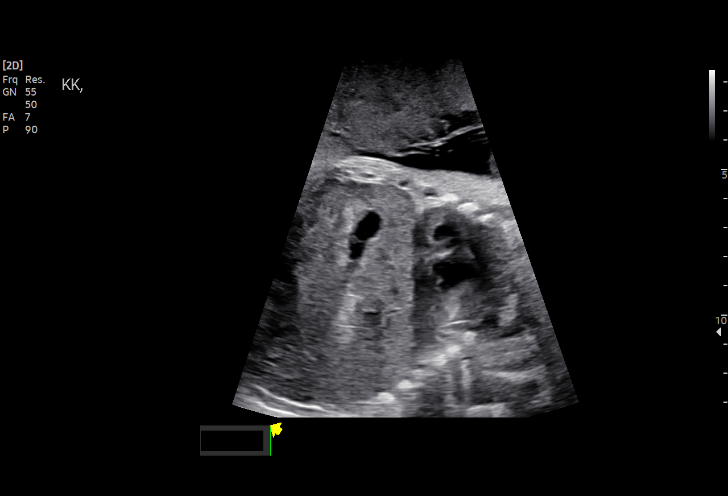
[im 34/44]
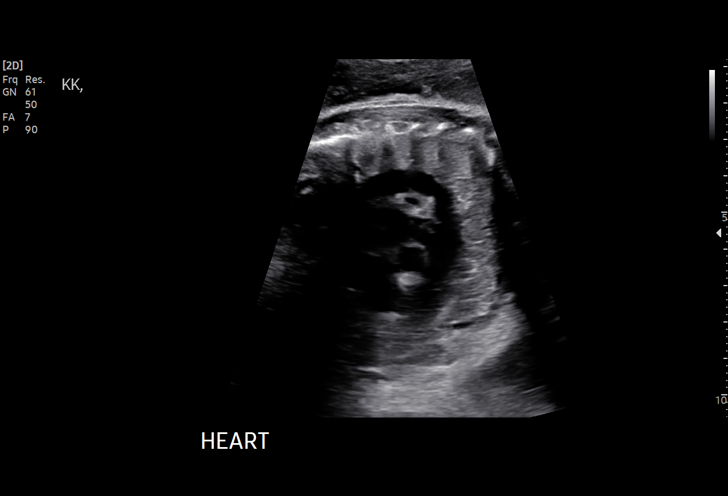
[im 37/44]
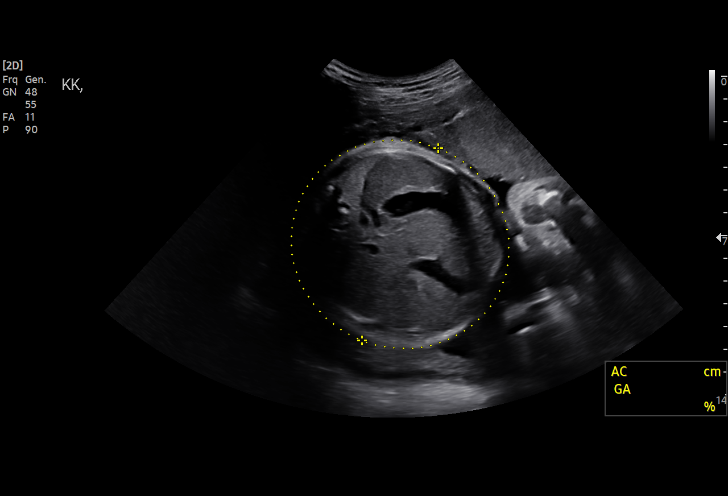
[im 40/44]
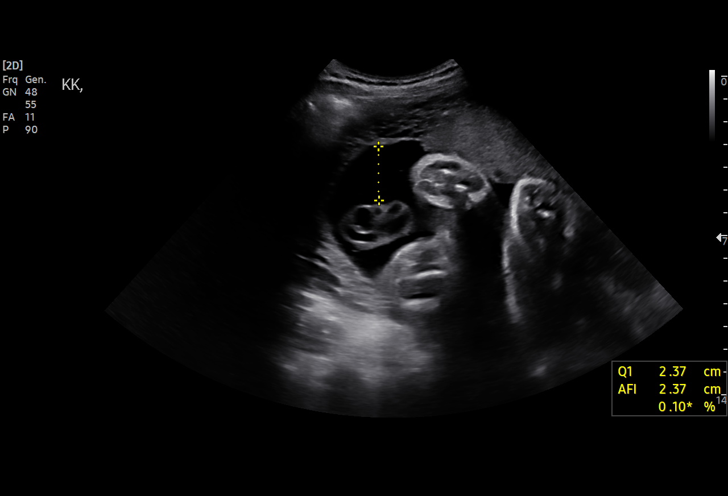
[im 44/44]
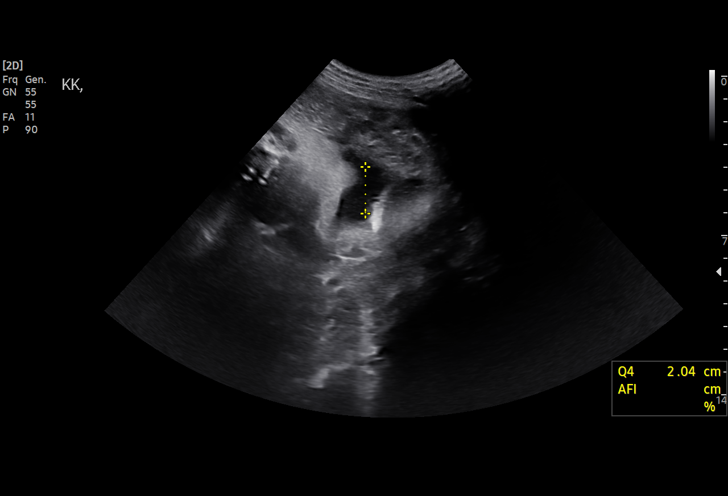

[14 of 28 positions shown; findings below may reference images not displayed]

Indications

 Poor obstetric history: Previous fetal growth
 restriction (FGR) (2257g @67w5d)
 Poor obstetric history: Previous preterm
 delivery, antepartum (67w5d)
 Rh negative state in antepartum
 Hemoglobin C trait
 32 weeks gestation of pregnancy
 Negative CF
 Encounter for other antenatal screening
 follow-up
Vital Signs

                                                 Height:        5'4"
Fetal Evaluation

 Num Of Fetuses:          1
 Fetal Heart Rate(bpm):   148
 Cardiac Activity:        Observed
 Presentation:            Cephalic
 Placenta:                Anterior
 P. Cord Insertion:       Previously Visualized

 Amniotic Fluid
 AFI FV:      Within normal limits

 AFI Sum(cm)     %Tile       Largest Pocket(cm)
 10.7            22
 RUQ(cm)       RLQ(cm)        LUQ(cm)        LLQ(cm)
 2.4           2
Biometry

 BPD:      81.2   mm     G. Age:  32w 4d         56  %    CI:         80.58  %    70 - 86
                                                          FL/HC:       22.4  %    19.1 -
 HC:      285.7   mm     G. Age:  31w 3d          5  %    HC/AC:       0.98       0.96 -
 AC:      290.9   mm     G. Age:  33w 1d         76  %    FL/BPD:      78.8  %    71 - 87
 FL:         64   mm     G. Age:  33w 0d         63  %    FL/AC:       22.0  %    20 - 24

 Est. FW:    0229   gm      4 lb 9 oz     63  %
OB History

 Gravidity:     2         Prem:  1
Gestational Age

 LMP:            32w 1d       Date:  09/04/19                   EDD:  06/10/20
 U/S Today:      32w 4d                                         EDD:  06/07/20
 Best:           32w 1d    Det. By:  LMP  (09/04/19)            EDD:  06/10/20
Anatomy

 Cranium:                Appears normal         Aortic Arch:            Appears normal
 Cavum:                  Previously seen        Ductal Arch:            Appears normal
 Ventricles:             Appears normal         Diaphragm:              Appears normal
 Choroid Plexus:         Previously seen        Stomach:                Appears normal, left
                                                                        sided
 Cerebellum:             Appears normal         Abdomen:                Appears normal
 Posterior Fossa:        Previously seen        Abdominal Wall:         Previously seen
 Nuchal Fold:            Previously seen        Cord Vessels:           Previously seen
 Face:                   Appears normal         Kidneys:                Appear normal
                         (orbits and profile)
 Lips:                   Appears normal         Bladder:                Appears normal
 Thoracic:               Appears normal         Spine:                  Previously seen
 Heart:                  Appears normal         Upper Extremities:      Previously seen
                         (4CH, axis, and situs)
 RVOT:                   Previously seen        Lower Extremities:      Previously seen
 LVOT:                   Previously seen

 Other:   Nasal bone, Heels, and 5th digit previously visualized. Technically
          difficult due to fetal position.
Cervix Uterus Adnexa

 Cervix
 Not visualized (advanced GA >68wks)
Impression

 Follow up growth given prior history of IUGR at 36 weeks
 Normal interval growth with measurements consistent with
 dates.
 Good fetal movement and amniotic fluid observed.
Recommendations

 Follow up growth was scheduled in 4 weeks given prior history
 of IUGR.

## 2022-04-21 ENCOUNTER — Ambulatory Visit: Payer: Self-pay

## 2022-04-21 DIAGNOSIS — Z3201 Encounter for pregnancy test, result positive: Secondary | ICD-10-CM

## 2022-04-21 LAB — POCT PREGNANCY, URINE: Preg Test, Ur: POSITIVE — AB

## 2022-04-21 NOTE — Progress Notes (Signed)
Patient dropped off urine today for a pregnancy test. Urine pregnancy test positive. I called patient to review these results with her. Per patient she had a positive pregnancy test at home yesterday. Patient denies any pain or vaginal bleeding. Per patient her LMP was 01/31/22 making her [redacted]w[redacted]d today with an EDD of 11/07/22. Patient states she does not have an OB/GYN currently. I provided patient with list and I instructed patient to choose from the list and call to set up new OB appointment. I recommended patient begin taking prenatal vitamins. Safe medication list provided to patient over mychart. Patient verbalized understanding and denies any other questions.   Alesia Richards, RN 04/21/22

## 2022-04-21 NOTE — Patient Instructions (Addendum)
[facility] Prenatal Care Providers          [facility] locations:  Hours may vary. Please call for an appointment  [facility] at [facility]             [address], [place], [place] [address] [phone]  [facility] at [facility]                                                             [address], [address], [place], [place], [address] [phone]  [facility] at [place]                                    [address], [address], [place], [place], [address] [phone]  [facility] at [place] [address], [address], [place], [place], [address] [phone]  [facility] at [place]                                 [address], [place], [place], [address] [phone]  [facility] at [facility]                                    [address], [place], [place], [address] [phone]  [facility] at [facility] [address], [address], [place], [place], [address]                                                   Safe Medications in Pregnancy    Acne: Benzoyl Peroxide Salicylic Acid  Backache/Headache: Tylenol: 2 regular strength every 4 hours OR              2 Extra strength every 6 hours  Colds/Coughs/Allergies: Benadryl (alcohol free) 25 mg every 6 hours as needed Breath right strips Claritin Cepacol throat lozenges Chloraseptic throat spray Cold-Eeze- up to three times per day Cough drops, alcohol free Flonase (by prescription only) Guaifenesin Mucinex Robitussin DM (plain only, alcohol free) Saline nasal spray/drops Sudafed (pseudoephedrine) & Actifed ** use only after [redacted] weeks gestation and if you do not have high blood pressure Tylenol Vicks Vaporub Zinc lozenges Zyrtec   Constipation: Colace Ducolax suppositories Fleet enema Glycerin  suppositories Metamucil Milk of magnesia Miralax Senokot Smooth move tea  Diarrhea: Kaopectate Imodium A-D  *NO pepto Bismol  Hemorrhoids: Anusol Anusol HC Preparation H Tucks  Indigestion: Tums Maalox Mylanta Zantac  Pepcid  Insomnia: Benadryl (alcohol free) 25mg every 6 hours as needed Tylenol PM Unisom, no Gelcaps  Leg Cramps: Tums MagGel  Nausea/Vomiting:  Bonine Dramamine Emetrol Ginger extract Sea bands Meclizine  Nausea medication to take during pregnancy:  Unisom (doxylamine succinate 25 mg tablets) Take one tablet daily at bedtime. If symptoms are not adequately controlled, the dose can be increased to a maximum recommended dose of two   tablets daily (1/2 tablet in the morning, 1/2 tablet mid-afternoon and one at bedtime). Vitamin B6 100mg tablets. Take one tablet twice a day (up to 200 mg per day).  Skin Rashes: Aveeno products Benadryl cream or 25mg every 6 hours as needed Calamine Lotion 1% cortisone cream  Yeast infection: Gyne-lotrimin 7 Monistat 7   **If taking multiple medications, please check labels to avoid duplicating the same active ingredients **take medication as directed on the label ** Do not exceed 4000 mg of tylenol in 24 hours **Do not take medications that contain aspirin or ibuprofen    

## 2022-06-11 ENCOUNTER — Telehealth: Payer: Self-pay

## 2022-06-11 NOTE — Telephone Encounter (Signed)
Pt requesting to receive her proof of pregnancy results.  I informed pt that she can get the results via MyChart or she can can come to the office fill out a ROI to get the results.  Pt verbalized understanding.  Leonette Nutting  06/11/22

## 2022-06-17 ENCOUNTER — Encounter: Payer: Self-pay | Admitting: Family Medicine
